# Patient Record
Sex: Male | Born: 1935 | Race: White | Hispanic: No | Smoking: Former smoker
Health system: Southern US, Community
[De-identification: ages and names within clinical notes are randomized; demographics above are authoritative.]

## PROBLEM LIST (undated history)

## (undated) DIAGNOSIS — Z8673 Personal history of transient ischemic attack (TIA), and cerebral infarction without residual deficits: Secondary | ICD-10-CM

## (undated) DIAGNOSIS — I4891 Unspecified atrial fibrillation: Secondary | ICD-10-CM

## (undated) DIAGNOSIS — I10 Essential (primary) hypertension: Secondary | ICD-10-CM

## (undated) DIAGNOSIS — I639 Cerebral infarction, unspecified: Secondary | ICD-10-CM

## (undated) HISTORY — DX: Personal history of transient ischemic attack (TIA), and cerebral infarction without residual deficits: Z86.73

## (undated) HISTORY — DX: Unspecified atrial fibrillation: I48.91

---

## 2006-06-27 ENCOUNTER — Emergency Department (HOSPITAL_COMMUNITY): Admission: EM | Admit: 2006-06-27 | Discharge: 2006-06-27 | Payer: Self-pay | Admitting: Emergency Medicine

## 2006-07-21 ENCOUNTER — Encounter: Payer: Self-pay | Admitting: Internal Medicine

## 2006-07-22 ENCOUNTER — Ambulatory Visit: Payer: Self-pay | Admitting: Thoracic Surgery (Cardiothoracic Vascular Surgery)

## 2006-07-22 ENCOUNTER — Ambulatory Visit: Payer: Self-pay | Admitting: Infectious Diseases

## 2006-07-22 ENCOUNTER — Inpatient Hospital Stay (HOSPITAL_COMMUNITY): Admission: AD | Admit: 2006-07-22 | Discharge: 2006-07-24 | Payer: Self-pay | Admitting: Internal Medicine

## 2006-07-22 ENCOUNTER — Ambulatory Visit: Payer: Self-pay | Admitting: Internal Medicine

## 2006-07-23 ENCOUNTER — Encounter (INDEPENDENT_AMBULATORY_CARE_PROVIDER_SITE_OTHER): Payer: Self-pay | Admitting: Cardiology

## 2006-08-01 ENCOUNTER — Emergency Department (HOSPITAL_COMMUNITY): Admission: EM | Admit: 2006-08-01 | Discharge: 2006-08-01 | Payer: Self-pay | Admitting: Emergency Medicine

## 2006-09-07 ENCOUNTER — Ambulatory Visit: Payer: Self-pay | Admitting: Cardiology

## 2006-09-07 ENCOUNTER — Inpatient Hospital Stay (HOSPITAL_COMMUNITY): Admission: EM | Admit: 2006-09-07 | Discharge: 2006-09-10 | Payer: Self-pay | Admitting: Emergency Medicine

## 2006-09-10 ENCOUNTER — Ambulatory Visit: Payer: Self-pay | Admitting: Physical Medicine & Rehabilitation

## 2006-09-10 ENCOUNTER — Inpatient Hospital Stay (HOSPITAL_COMMUNITY)
Admission: RE | Admit: 2006-09-10 | Discharge: 2006-09-19 | Payer: Self-pay | Admitting: Physical Medicine & Rehabilitation

## 2006-09-19 ENCOUNTER — Inpatient Hospital Stay: Admission: AD | Admit: 2006-09-19 | Discharge: 2006-11-21 | Payer: Self-pay | Admitting: Internal Medicine

## 2006-10-27 ENCOUNTER — Ambulatory Visit (HOSPITAL_COMMUNITY): Admission: RE | Admit: 2006-10-27 | Discharge: 2006-10-27 | Payer: Self-pay | Admitting: Internal Medicine

## 2006-11-10 ENCOUNTER — Ambulatory Visit: Payer: Self-pay | Admitting: Physical Medicine & Rehabilitation

## 2006-11-10 ENCOUNTER — Encounter
Admission: RE | Admit: 2006-11-10 | Discharge: 2006-12-30 | Payer: Self-pay | Admitting: Physical Medicine & Rehabilitation

## 2006-12-18 ENCOUNTER — Ambulatory Visit: Payer: Self-pay | Admitting: Cardiology

## 2006-12-24 ENCOUNTER — Encounter (HOSPITAL_COMMUNITY): Admission: RE | Admit: 2006-12-24 | Discharge: 2006-12-30 | Payer: Self-pay | Admitting: Family Medicine

## 2006-12-31 ENCOUNTER — Encounter (HOSPITAL_COMMUNITY): Admission: RE | Admit: 2006-12-31 | Discharge: 2007-01-30 | Payer: Self-pay | Admitting: Internal Medicine

## 2007-01-09 ENCOUNTER — Ambulatory Visit (HOSPITAL_COMMUNITY): Admission: RE | Admit: 2007-01-09 | Discharge: 2007-01-09 | Payer: Self-pay | Admitting: Internal Medicine

## 2007-02-02 ENCOUNTER — Encounter (HOSPITAL_COMMUNITY): Admission: RE | Admit: 2007-02-02 | Discharge: 2007-03-04 | Payer: Self-pay | Admitting: Internal Medicine

## 2007-02-09 ENCOUNTER — Encounter
Admission: RE | Admit: 2007-02-09 | Discharge: 2007-02-10 | Payer: Self-pay | Admitting: Physical Medicine & Rehabilitation

## 2007-02-09 ENCOUNTER — Ambulatory Visit: Payer: Self-pay | Admitting: Physical Medicine & Rehabilitation

## 2007-03-06 ENCOUNTER — Encounter (HOSPITAL_COMMUNITY): Admission: RE | Admit: 2007-03-06 | Discharge: 2007-04-01 | Payer: Self-pay | Admitting: Internal Medicine

## 2010-08-14 NOTE — Group Therapy Note (Signed)
NAMEDELWYN, SCOGGIN                 ACCOUNT NO.:  [id number]   MEDICAL RECORD NO.:  [id number]          PATIENT TYPE:  INP   LOCATION:  A228                          FACILITY:  [facility]   PHYSICIAN:  Catalina Pizza, M.D.        DATE OF BIRTH:  [birth year]   DATE OF PROCEDURE:  09/08/2006  DATE OF DISCHARGE:                                 PROGRESS NOTE   SUBJECTIVE:  Mr. 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  He also has a  history of atrial fibrillation but has refused anticoagulation before  knowing the risks of having a potential stroke.  He had a recent episode  of some congestive heart failure, found to have an EF of approximately  40%.  Had the slurred speech and right-sided weakness, which started  yesterday morning, and was brought into the emergency department and  apparently the ER physician spoke with a stroke doctor at [facility] and  it was agreed upon to keep him here but acutely heparinize him given his  atrial fibrillation.  The patient did get MRI this morning and results  are outlined below.  The patient at this time denies any specific pain  but continues to have aphasia and a question of some increased weakness  on the right side.   OBJECTIVE:  VITAL SIGNS:  Temperature is 97.0, blood pressure 161/73,  pulse 59, respirations 18, saturating 97% on 1 L of oxygen.  GENERAL:  This is an elderly white male lying in bed in no acute  distress.  HEENT:  Unremarkable.  Oropharynx is clear.  Mucous membranes are moist.  LUNGS:  Clear to auscultation bilaterally.  HEART:  Irregularly irregular rhythm.  A question of faint 1/6 systolic  murmur at the left upper sternal border.  ABDOMEN:  Soft, nontender, nondistended, positive bowel sounds.  EXTREMITIES:  No lower extremity edema.  SKIN:  No rash or petechiae apparent.  NEUROLOGIC:  The  patient is alert and oriented x3, but he does have some  expressive aphasia, unable to get all his words out.  Does take more  time to enunciate things.  He has right facial droop, which is more  apparent than previous at this time.  He also has some right-sided  pronator drift as well as some generalized weakness, approximately 4 to  +/5 on the right arm and right leg as compared to left side.  Follows  commands appropriately.   Lab work obtained:  As previously reviewed yesterday.  No significant  new lab work apparent.   Carotid Dopplers were done, which show minimal plaque in the left ICA  and no evidence of hemodynamically significant stenosis.  MRI/MRA  findings were consistent with acute ischemic infarct in the left  parietal subcortical white matter area, old left cerebellar lacune, and  mild small-vessel disease changes seen throughout.  MRA showed probable  high-grade stenosis in the proximal left middle cerebral artery.   IMPRESSION:  This  is an 77 year old gentleman with new stroke findings  with right hemiparesis and aphasia.   ASSESSMENT AND PLAN:  1. 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  The patient is heparinized at this time      and continues to do so.  Did speak with the stroke doctor nurse      practitioner and I need to better find his wishes as far as whether      to coumadinize him.  Dependent on his wishes, he has refused prior      to now even knowing the risks of this.  Unclear if this is acutely      related to atrial fibrillation since it is only one specific      lesion, no embolic-type noted, it is more ischemic-related.  He has      refused multiple blood pressure medicines as well and was recently      started on Diovan in addition to metoprolol.  Will resume these      medicines without significantly lowering his blood pressure given      his recent stroke.  2. Atrial fibrillation.  He has  slow-rate atrial fibrillation at this      time and has apparently had this for long standing and has refused      coumadinization.  We will continue with the metoprolol for rate      control and we will continue to monitor.  3. Osteomyelitis.  The patient is finishing a 3-week additional      treatment after getting approximately 6 weeks of Ancef at this      time.  Was going to reassess in 3 weeks and see whether to      transition over to oral Keflex and continue this for another month      per infectious disease, Dr. Bonnetta Barry recommendations.   DISPOSITION:  We will continue the patient at his current situation and  repeat 2 D echo at this point and have to better define what his  treatment wishes are for his atrial fibrillation and whether Coumadin is  the best option for him.  We will get Dr. Gerilyn Pilgrim to see the patient.  Likely will need rehab, both speech therapy and significant physical  therapy and may be a good candidate for rehab at [facility].  We will  have to discuss further with the patient and his daughter.      Catalina Pizza, M.D.  Electronically Signed     ZH/MEDQ  D:  09/08/2006  T:  09/08/2006  Job:  [id number]

## 2010-08-14 NOTE — Letter (Signed)
December 18, 2006    Catalina Pizza, M.D.  [address]  [place],  [place] [address]   RE:  [name], [name]  MRN:  [id number]  /  DOB:  February 06, 1916   Dear [name]:   It was my pleasure evaluating [name] in consultation today in the  office at your request.  As you know, this nice gentleman has enjoyed  generally excellent health despite the fact that he is nearly 77 years  of age.  Unfortunately despite having known atrial fibrillation, he had  previously refused to take Warfarin and suffered a left parietal CVA in  June.  His course was complicated by sternal osteomyelitis, but he  eventually went to rehabilitation at [facility] and did well.  His aphasia has improving substantially. He has some right sided  weakness but gets around well with a cane.  He suffered a fall that  occurred when he was first re-learning to walk stairs.  Bradycardia has  intermittently been noted prompting this referral.  There is also a  vague history of congestive heart failure.  His echocardiogram showed  mildly impaired left ventricular systolic function with an ejection  fraction of 45%.  The patient's family, with whom he resides, recently  noticed some pedal edema.  This resolved after a few days of diuretic.   [name] was working 32 hours per week at [employer] before his CVA.  He  has not yet returned to that but hopes to.  INR was 2.5 six days ago.  The patient denies all symptoms.  He has had no dizziness.  He has not  suffered any syncopal spells.  He has no dyspnea nor chest discomfort.   CURRENT MEDICATIONS:  1. Warfarin as directed from your office.  2. Avapro 300 mg daily.  3. Clonidine 0.1 mg b.i.d.   PAST MEDICAL HISTORY:  Notable for:  1. Hypertension.  2. Mild chronic kidney disease.  3. Diverticular disease.   [name] has not previously undergone surgery.  Blood pressure control  has been good.   SOCIAL HISTORY:  As noted above; widowed with 3 children.   FAMILY HISTORY:   Mother and father both died in their 32s.  He also has  8 siblings who are deceased.  Their illnesses included diabetes,  malaria, neoplastic disease, and CVA.   REVIEW OF SYSTEMS:  Notable for the need for corrective lenses, previous  cataract surgery, some hearing impairment, urinary frequency, and  diffuse arthritic discomfort.   PHYSICAL EXAMINATION:  GENERAL:  A trim pleasant gentleman with mild  dysphasia in no acute distress.  VITAL SIGNS:  The weight is 136.  Blood pressure 150/70, heart rate 47  and irregular, respirations 16.  NECK:  No jugular venous distention; normal carotid upstrokes without  bruits.  ENDOCRINE:  No thyromegaly.  HEMATOPOIETIC:  No adenopathy.  SKIN:  No significant lesions.  LUNGS:  Clear.  CARDIAC:  Normal 1st and 2nd heart sounds; modest systolic ejection  murmur.  Normal PMI.  ABDOMEN:  Soft and nontender; normal bowel sounds; no organomegaly.  EXTREMITIES:  One half plus ankle edema; distal pulses intact.  NEUROMUSCULAR:  Four out of 5 strength on the right, more notable in the  lower extremity.   EKG:  Atrial fibrillation with a slow ventricular response; left  anterior fascicular block; nonspecific ST segment abnormalities; delayed  R wave progression - cannot exclude prior septal myocardial infarction;  no old tracing for comparison.   IMPRESSION:  [name]  has sick sinus syndrome.  Despite the presence of  atrial fibrillation and the absence of AV nodal blocking agents, heart  rate is only 47.  Although clonidine may be associated with brady  arrhythmias, I doubt that is playing a role in this case.  In the  absence of symptoms, no intervention is warranted.   I discussed the possible need for pacing with [name] and his  daughter.  The patient says that he would refuse pacemaker if required.  This would be unfortunate, but may not prove to be the  case when symptomatic bradycardia actually occurs.  Please let me know  if [name]  experiences dizziness or syncope.  I will plan to see this  nice gentleman again in 9 months and leave management of all of his  medical problems including anemia and anticoagulation to your  discretion.    Sincerely,      Gerrit Friends. Dietrich Pates, MD, [facility]  Electronically Signed    RMR/MedQ  DD: 12/18/2006  DT: 12/18/2006  Job #: [id number]

## 2010-08-14 NOTE — Discharge Summary (Signed)
[name], [name]                 ACCOUNT NO.:  [id number]   MEDICAL RECORD NO.:  [id number]          PATIENT TYPE:  IPS   LOCATION:  4025                         FACILITY:  [facility]   PHYSICIAN:  Ranelle Oyster, M.D.DATE OF BIRTH:  [birth year]   DATE OF ADMISSION:  09/10/2006  DATE OF DISCHARGE:                               DISCHARGE SUMMARY   DISCHARGE DIAGNOSES:  1. Left parietal subcortical cerebrovascular accident.  2. Aphasia.  3. Dysphagia.  4. Atrial fibrillation.  5. Staphylococcus sternal osteomyelitis.  6. Mild renal insufficiency.  7. Hypertension.  8. Bradycardia.   An 77 year old white male, history of atrial fibrillation but never on  anticoagulation; sternal osteomyelitis with questionable etiology April  2008 with intravenous Ancef per MRI.  Recent echocardiogram with  ejection fraction 40-45%.  The patient had refused TEE.  Admitted September 07, 2006, to [facility] with slurred speech and  decreased gait.  MRI of the head showed patchy acute ischemic  infarction, left parietal subcortical white matter, as well as old  lacunar infarction left cerebellar hemisphere.  Carotid Dopplers were  negative for internal carotid artery stenosis.  MRA showed left MCA  proximal with high-grade hemodynamic-significant stenosis as well as 50%  stenosis in the proximal basilar artery.  Placed on intravenous heparin  and Coumadin for both atrial fibrillation and cerebrovascular accident.  Remained on intravenous Ancef for sternal osteomyelitis with blood  cultures positive for Staphylococcus aureus per Dr. Ninetta Lights of  infectious disease.  Antibiotics were to be completed on September 28, 2006,  and then transitioned to Keflex 500 mg four times a day x1 month.   PAST MEDICAL HISTORY:  1. Hypertension.  2. Staphylococcus osteomyelitis of the sternum April 2008.  3. Atrial fibrillation.  4. Diverticulitis.   No alcohol or tobacco.   ALLERGIES:  Questionable to  ASPIRIN.   SOCIAL HISTORY:  Lives with daughter.  He worked part-time at [employer]  prior to admission.   MEDICATIONS PRIOR TO ADMISSION:  Advil, intravenous Ancef, and  hydrochlorothiazide.  His antibiotics were administered by [facility].   REHABILITATION HOSPITAL COURSE:  The patient was admitted to inpatient  rehab services with therapies initiated on a 3-hour-daily basis  consisting of physical therapy, occupational therapy, speech therapy,  and rehabilitation nursing.  The following issues were addressed during  the patient's rehabilitation stay.  Pertaining to [name] left  parietal subcortical cerebrovascular accident, remained stable.  He was  minimum to moderate assist for transfers, total assist ambulation,  minimum to moderate assist for wheelchair mobility, needing moderate  cues.  Noted to be aphasic.  He was able to communicate basic needs.  He  was followed by speech therapy for swallowing difficulties after stroke.  His diet was a dysphagia one nectar-thick liquids diet.  His thickened  liquids were administered by teaspoon.  He was full supervision.  They  were crushing his medications and giving them in puree.  Due to his  thickened liquids, close monitoring of his renal function.  Latest  creatinine of 1.7, BUN of 45.  His hydrochlorothiazide  had been held on  June 19 with encouragement of fluids with followup chemistries pending.  He had been placed on intravenous heparin and Coumadin for his atrial  fibrillation, latest INR of 1.8.  He was on subcutaneous Lovenox once on  the rehab unit until INR greater than 2.  He was completing a course of  intravenous Ancef sternal osteomyelitis to be completed June 29.  After  completion of intravenous Ancef he would transition to 1 month of Keflex  p.o. 500 mg four times daily.  Monitoring of blood pressure.  He did  have some bradycardia, pulse rates of 45.  He was asymptomatic.  His  metoprolol was held June  19 at 25 mg twice daily.  It would be resumed  on June 20 at 12.5 mg twice daily and monitored.   Latest labs showed an INR of 1.9 on June 19, hemoglobin 12.1; hematocrit  36.1; platelets 315,000; WBC of 8.8.  Sodium 138, potassium 4.2, BUN of  45, creatinine 1.7.   DISCHARGE MEDICATIONS AT TIME OF DICTATION:  1. Coumadin 2 mg daily except 4 mg every Tuesday.  2. Avapro 300 mg p.o. daily.  3. Lopressor 12.5 mg twice daily.  4. Desyrel 50 mg p.o. q.h.s. as needed.  5. The patient's hydrochlorothiazide of 25 mg daily was on hold.  6. Potassium chloride 20 mEq daily on hold.  7. Clonidine 0.1 mg p.o. b.i.d.  8. Intravenous Ancef 2 g every 8 hours until September 28, 2006.  Begin      Keflex 500 mg p.o. q.i.d. after Ancef completed for a total of one      more month and stop.   DIET:  Dysphagia one nectar-thick liquids by teaspoon, small bites,  swallow x2 with each bite, full supervision, medications to be crushed  in puree per speech therapy.  Monitoring of renal function while on  thickened liquids.   PICC line in place for administration of intravenous antibiotics.  This  can be discontinued once Ancef completed and changed to p.o. Keflex.   He should follow up Dr. Catalina Pizza, [facility] Ace, medical management.  He  could be covered by attending physician while at skilled nursing  facility at this time.  Dr. Ninetta Lights, infectious disease; Dr. Faith Rogue at the outpatient rehab service office, call for appointment.      Mariam Dollar, P.A.      Ranelle Oyster, M.D.  Electronically Signed    DA/MEDQ  D:  09/18/2006  T:  09/18/2006  Job:  [id number]   cc:   Catalina Pizza, M.D.  Lacretia Leigh. Ninetta Lights, M.D.

## 2010-08-14 NOTE — H&P (Signed)
NAMEDAMOND, BORCHERS                 ACCOUNT NO.:  [id number]   MEDICAL RECORD NO.:  [id number]          PATIENT TYPE:  INP   LOCATION:  A228                          FACILITY:  [facility]   PHYSICIAN:  Edward L. Juanetta Gosling, M.D.DATE OF BIRTH:  June 27, 1915   DATE OF ADMISSION:  09/07/2006  DATE OF DISCHARGE:  LH                              HISTORY & PHYSICAL   PRIMARY CARE PHYSICIAN:  Catalina Pizza, M.D.   REASON FOR ADMISSION:  Stroke.   HISTORY OF PRESENT ILLNESS:  Mr. Robicheaux is an 77 year old who was seen by  his family around 10:30 a.m. this morning.  He had driven himself to the  mall and walked in the mall and then after they came back from church,  he had slurred speech and difficulty with ambulation.  He has a long  known history of atrial fibrillation, he says since WWII, and has never  been on any anticoagulation.  He has a history of a chest wall pain with  sternal osteomyelitis that started in April of this year.  He has been  on Ancef and seems to have gotten better, but he says that he needs  another 3 weeks of the antibiotic.  He also has a history of  hypertension and is not totally clear what all he is taking.  Apparently, he is still taking Ancef, hydrochlorothiazide for his blood  pressure and was recently started on Diovan.  He has had a waxing and  waning course of slurred speech and difficulty with expressive aphasia  in the emergency room.  Dr. Lynelle Doctor, who is the emergency room physician,  has had multiple conversations with the stroke team at [facility] and  after reviewing his data, reviewing his x-rays and CT, etc., we feel  that there is little to do except to go ahead and anticoagulate him now  because of the atrial fibrillation.   PAST MEDICAL HISTORY:  1. Hypertension.  2. Atrial fibrillation, chronic.  3. Diverticulitis and diverticulosis and he apparently had some sort      of a ruptured abdominal diverticulum many years ago and was told      that he should not  take aspirin.  4. Sternal osteomyelitis.  He has been taking Advil as needed, Ancef      now for a period of a almost 2 months and hydrochlorothiazide, I do      not know the dosing, and recently had Diovan, I do not know the      dose, added from Dr. Scharlene Gloss office.  This was a sample.   ALLERGIES:  He is said to be allergic to ASPIRIN, but that is because  they said that he should stop taking it.   SOCIAL HISTORY:  He has worked as a Airline pilot.  He is currently working  want to take greeter or did up until 2 months ago.  He had about a 20-  pack-year smoking history, none in the last 20 years or more.  He does  not drink any alcohol.  Does not use any illicit drugs.   FAMILY HISTORY:  Positive apparently for stroke, but it is difficult to  be sure from him because of his aphasia.   PHYSICAL EXAMINATION:  GENERAL:  He is moving all four extremities.  VITAL SIGNS:  Temperature 98, pulse 59 and irregular, respirations 16,  O2 saturations 95% on room air, blood pressures ranged between 157/73 to  192/74.  HEENT:  His pupils are reactive.  Nose and throat are clear.  His mucous  membranes are moist.  NECK:  Supple.  I do not hear a bruit.  He does not have any jugular  venous distension.  CHEST:  Fairly clear without wheezes, rales or rhonchi.  HEART:  Irregular.  I do not hear a murmur, but he said to have some  mitral valve incompetence.  I do not actually hear the murmur now.  He  is talking during the exam, so it is a little difficult to be sure.  ABDOMEN:  Soft.  EXTREMITIES:  No clubbing, cyanosis or edema.  NEUROLOGIC:  He previously did have a facial droop which has resolved.  He still is somewhat aphasic.  He had some weakness of his right hand  and some numbness of his right hand and that has resolved.  He has  normal, equal, bilateral strength in both his arms and his legs.   LABORATORY DATA AND X-RAY FINDINGS:  White blood count 7900, hemoglobin  11.1, platelets 270.  PT  14.9 with an INR of 1.1.  Cardiac markers  normal.  Comprehensive metabolic profile with glucose 045, BUN 29,  creatinine 1.3, albumin 3.3.  PTT greater than 200.  He has had heparin.   CT of head with hyperdense, left middle cerebral artery branch in the  left sylvian fissure, acute thrombus not excluded, otherwise, no signs  of acute ischemia.  As mentioned, this has been reviewed by the  neurologist and the invasive radiologist.  He does not have any other  findings on CT.   ASSESSMENT/PLAN:  Acute neurological change which has been waxing and  waning to some extent.  It is recommended by the neurologist that he be  placed on heparin because of his atrial fibrillation.  He is going to  have an MRI/MRA of the brain, echocardiogram, carotid Doppler study,  speech evaluation.  He is going to continue on his Ancef.  He is going  to continue on his Diovan.  Continue on his hydrochlorothiazide, but at  this point, there is little else to do.  He does have, according to  consultation from April of this year, mitral regurgitation, tricuspid  regurgitation and left ventricular ejection fraction of about 40%.      Edward L. Juanetta Gosling, M.D.  Electronically Signed     ELH/MEDQ  D:  09/07/2006  T:  09/08/2006  Job:  [id number]

## 2010-08-14 NOTE — Group Therapy Note (Signed)
NAMEJAAMAL, [name]                 ACCOUNT NO.:  [id number]   MEDICAL RECORD NO.:  [id number]          PATIENT TYPE:  INP   LOCATION:  A228                          FACILITY:  [facility]   PHYSICIAN:  Catalina Pizza, M.D.        DATE OF BIRTH:  [birth year]   DATE OF PROCEDURE:  09/09/2006  DATE OF DISCHARGE:                                 PROGRESS NOTE   SUBJECTIVE:  [name] is an unfortunate 77 year old gentleman who  presented with acute stroke and found to have left acute ischemic  infarct with right-side hemiparesis and aphasia.  His functionality and  his aphasia is waxing and waning somewhat, but continues to have  difficulty with slurred speech and getting the words out, as well as  some right-sided weakness.   OBJECTIVE:  VITAL SIGNS:  Temperature is 98.1, blood pressure is ranging  between 154/77 up to 187/83.  Pulse is 47 to 61, respirations are 20,  saturating 99% on room air.  GENERAL:  This is an elderly gentleman lying in bed in no acute  distress, alert and oriented to person and place and time.  HEENT:  Unremarkable.  Does have significant right-sided facial droop.  LUNGS:  Clear to auscultation bilaterally.  HEART:  Irregularly irregular, faint 1/6 systolic murmur left upper  sternal border.  ABDOMEN:  Soft, nontender, nondistended, positive bowel sounds.  EXTREMITIES:  No lower extremity edema.  NEUROLOGIC:  The patient may have a question of mild improvement in  strength in his right arm and definite improvement in his right leg.  Still has significant aphasia and facial droop.   Lab work obtained today shows white count of 8.8, hemoglobin of 12.2.  Heparin level within range.  PT starting at 15.6, INR of 1.2.   IMPRESSION:  This is an 77 year old gentleman with new stroke, findings  of right hemiparesis and aphasia.   ASSESSMENT AND PLAN:  1. Left acute ischemic infarct with right-sided hemiparesis and      aphasia.  Awaiting further input from Dr. Gerilyn Pilgrim, but in  speaking      with Dr. Pearlean Brownie with the stroke team and discussing with his      daughter, the patient has agreed to start on Coumadin to prevent      any further signs of this.  He does have significant narrowing      noted on MRA and this may prevent any further new reoccurrence.  2. Atrial fibrillation.  His heart rate is slow at this point and will      continue with the metoprolol as well as the Diovan.  3. Hypertension.  He has significant elevation of blood pressure.  I      do not want to bottom it out, given his perfusion of his brain,      with a goal possibly of systolic blood pressure 150.  He has      refused all blood pressure medicines up until recently and started      back on medicines, probably not reaching maximal effect at this  time.  4. Osteomyelitis.  Continue with the current treatment with Ancef.   DISPOSITION:  In discussion with the patient and his daughter, [name],  agree that he will need significant rehab.  Following Dr. Gerilyn Pilgrim, we  will get social work and care management to work on getting physical  therapy and speech therapy and occupational therapy to assess the  patient, and I believe he would be a good candidate for rehab at [facility].      Catalina Pizza, M.D.  Electronically Signed     ZH/MEDQ  D:  09/09/2006  T:  09/09/2006  Job:  [id number]

## 2010-08-14 NOTE — Procedures (Signed)
NAMETELFORD, ARCHAMBEAU                 ACCOUNT NO.:  [id number]   MEDICAL RECORD NO.:  [id number]          PATIENT TYPE:  INP   LOCATION:  A228                          FACILITY:  [facility]   PHYSICIAN:  Gerrit Friends. Dietrich Pates, MD, FACCDATE OF BIRTH:  25-Nov-1915   DATE OF PROCEDURE:  09/09/2006  DATE OF DISCHARGE:  09/10/2006                                ECHOCARDIOGRAM   REFERRING:  Catalina Pizza, M.D.   CLINICAL DATA:  An 77 year old gentleman with CVA; history of  hypertension.  M-mode:  Aorta 3.5, left atrium 4.5, septum 1.2,  posterior wall 1.2, LV diastole 4.8, LV systole 3.6.   1. Technically adequate echocardiographic study.  2. Mild left and right atrial enlargement.  3. Right ventricular size at the upper limit of normal; normal wall      thickness; normal RV systolic function.  4. Trileaflet aortic valve with minimal sclerosis.  5. Mildly thickened mitral valve with minimal calcification      ossification and mild regurgitation.  6. Normal tricuspid valve; mild regurgitation; estimated RV systolic      pressure at the upper limit of normal.  7. Normal pulmonic valve and proximal pulmonary artery.  8. Normal left ventricular size; borderline LVH; low normal LV      systolic function.  9. Normal IVC.  10.Normal thoracic aorta.      Gerrit Friends. Dietrich Pates, MD, [facility]  Electronically Signed     RMR/MEDQ  D:  09/10/2006  T:  09/10/2006  Job:  [id number]

## 2010-08-14 NOTE — Group Therapy Note (Signed)
NAMETEDFORD, BERG                 ACCOUNT NO.:  [id number]   MEDICAL RECORD NO.:  [id number]          PATIENT TYPE:  ORB   LOCATION:  S159                          FACILITY:  [facility]   PHYSICIAN:  Angus G. 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      Angus G. Renard Matter, MD  Electronically Signed     AGM/MEDQ  D:  09/25/2006  T:  09/25/2006  Job:  [id number]

## 2010-08-14 NOTE — Assessment & Plan Note (Signed)
[name] is back regarding his left parietal stroke.  He has been at  home and generally he had been doing well.  He apparently had one fall  where he slipped at home but otherwise has been doing okay since then.  He denies pain today except for a little discomfort at the right hip.  He is sleeping well.  He has a little bit of anxiety with falling but  generally has no other issues.   REVIEW OF SYSTEMS:  Is notable for the above.  He does report some poor  appetite.  A full review is in the written Health & History Section on  the chart.   SOCIAL HISTORY:  Patient is divorced and living alone with family  checking on him.   PHYSICAL EXAM:  Blood pressure is 160/80, pulse is 50, respiratory rate  16, sating 100% in room air.  Patient is pleasant, alert and oriented  x3.  The patient does really well with conversational speech but has  some difficulty naming objects.  He has some dysarthria and hypophonia  but this improved.  Strength is improved to near 5/5 on all 4  extremities.  He still has some apraxia with the right hand and foot but  seems fairly stable on his feet.  He walks with a rolling walker without  any other support.  No hypertonicity is seen.  Sensation is grossly  intact.  Cognition is appropriate.  HEART:  Regular rhythm, slightly bradycardic.  CHEST:  Clear.  ABDOMEN:  Soft, nontender.   ASSESSMENT:  1. Left parietal/subcortical stroke.  2. Aphasia.  3. Dysphagia.  4. Atrial fibrillation.  5. Hypertension.   PLAN:  1. Continue therapies per recommendations of outpatient center.  I      will sign off on whatever we need to going forward here.  He should      transition to a home exercise program.  2. I have nothing further to offer [name] today.  He is generally      doing quite well.  I will follow up with him on a p.r.n. basis in      the future.      Ranelle Oyster, M.D.  Electronically Signed     ZTS/MedQ  D:  02/10/2007 11:11:34  T:   02/10/2007 16:49:19  Job #:  [id number]   cc:   Catalina Pizza, M.D.  Fax: [phone]

## 2010-08-14 NOTE — Consult Note (Signed)
[name], BLOXHAM                 ACCOUNT NO.:  [id number]   MEDICAL RECORD NO.:  [id number]          PATIENT TYPE:  INP   LOCATION:  A228                          FACILITY:  [facility]   PHYSICIAN:  Kofi A. Gerilyn Pilgrim, M.D. DATE OF BIRTH:  [birth year]   DATE OF CONSULTATION:  09/09/2006  DATE OF DISCHARGE:                                 CONSULTATION   This is an 77 year old white male who presented to the hospital with  slurred speech and difficulty talking along with difficulty ambulating.  The patient has a long history of atrial fibrillation but was never on  anticoagulation.  The patient's workup includes a head CT scan and MRI,  both with evidence of acute left MCA infarct, particularly involving the  parietal area.   PAST MEDICAL HISTORY:  1. Chronic atrial fibrillation.  2. Hypertension.  3. Diverticular disease/diverticulitis.  4. Sternal osteomyelitis.   ADMISSION MEDICATIONS:  1. Ancef.  2. Hydrochlorothiazide.   ALLERGIES:  ASPIRIN.   CURRENT MEDICATIONS:  1. Hydrochlorothiazide.  2. Ancef.  3. Metoprolol.  4. Warfarin.   SOCIAL HISTORY:  The patient previously worked as an Airline pilot. He has  a 20-pack-year smoking history, though he has not smoked in over 20  years.  No alcohol use.  No illicit drug use.   PHYSICAL EXAMINATION:  GENERAL:  This is a pleasant gentleman in no  acute distress  VITAL SIGNS:  Temperature 98.3, pulse 56, respirations 16, blood  pressure 167/81.  HEENT:  Neck is supple.  Head is normocephalic, atraumatic.  ABDOMEN:  Soft.  EXTREMITIES:  No significant edema or varicosities.  MENTATION:  His carotid Doppler was negative.  The patient is awake and  alert.  He cooperates well with the physical examination.  He has an  obvious expressive aphasia with significantly impaired fluency.  He does  well on commands.  Repetition is significantly impaired.  Naming is also  severely impaired, but comprehension is preserved.  Cranial nerve  evaluation shows a right hemianopsia anopia.  Pupils are equal, round,  and reactive to light and accommodation.  Extraocular movements are  intact.  There is flattening of the nasal labial fold on the right.  Tongue is midline.  Uvula is midline.  Right shoulder shrug and motor  examination shows a dense right hemiparesis, right upper extremity 2/5  and right lower extremity 3/5.  The left side shows normal tone, bulk  and strength.  Coordination shows no tremors, rigidity or bradykinesia.  Reflexes are symmetric.  Sensation is somewhat unreliable but seems  unremarkable to light touch bilaterally.    MRI of the brain shows acute infarct involving the left parietal area.  There is an old left cerebellar infarct.  MRA of the brain shows drop-  off of the left MCA which is expected given his infarct.  There is  approximately 50% stenosis over the proximal basilar   ASSESSMENT:  1. Acute left parietal infarct in the setting of atrial fibrillation.  2. Proximal basilar artery stenosis, MRA estimation with 50%, though      MRA tends to over-estimate; clinical significance  doubtful at this      time.  3. Hypertension.   RECOMMENDATIONS:  Agree with chronic Warfarin therapy and hypotensive  control.      Kofi A. Gerilyn Pilgrim, M.D.  Electronically Signed     KAD/MEDQ  D:  09/10/2006  T:  09/10/2006  Job:  [id number]

## 2010-08-14 NOTE — Group Therapy Note (Signed)
[name], [name]                 ACCOUNT NO.:  [id number]   MEDICAL RECORD NO.:  [id number]          PATIENT TYPE:  ORB   LOCATION:  S159                          FACILITY:  [facility]   PHYSICIAN:  Catalina Pizza, M.D.        DATE OF BIRTH:  April 24, 1915   DATE OF PROCEDURE:  09/29/2006  DATE OF DISCHARGE:                                 PROGRESS NOTE   This is a history and physical for the nursing home. The patient is a  resident at [facility]. Was discharged from rehab by Dr. Riley Kill  on September 18, 2006.   RECENT HISTORY IN SUMMATION:  [name] is a pleasant 77 year old  gentleman who up until April of this year had minimal medical problems  and  did not see a doctor routinely. At that time,  he developed a staph  osteomyelitis in his chest. Had a short hospitalization at [facility].  Noted to have some congestive heart failure as well as atrial  fibrillation. Refused anticoagulation. At that time, he did have  significant hypertension as well, which was refusing most all of  therapy. He was started on IV antibiotics and this was continued for 6  weeks routinely.  He then acutely developed slurred speech and  difficulty with gait and aphasia  and found to have significant left  parietal subcortical CVA. At that time he was seen by Neurology that  started him on Coumadin, would be the best option.  He was sent down to  [facility] for approximately 9 days worth of rehab and at that  time was sent back up to [facility] for further rehabilitation.   PAST MEDICAL HISTORY:  1. Hypertension.  2. 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  3. Recent diagnosis atrial fibrillation.  4. History of diverticulosis.  5. Cerebral vascular accident.   FAMILY HISTORY:  Positive for coronary artery disease.   REVIEW OF SYSTEMS:  Patient at this point denies any chest pain.  No  problems with breathing. States he is still having some difficulty with  getting the words  out appropriately.  Obvious neurologic deficits as far  as his full strength and mobility as outlined previously by Dr. Riley Kill.   SOCIAL HISTORY:  Has a daughter and son-in-law whom the patient was  previously living with in the short term after living by himself for a  long period time. He does not have any alcohol or tobacco use.  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   ALLERGIES:  QUESTIONABLE INTOLERANCE TO ASPIRIN.   MEDICATIONS:  1. Coumadin. At this time his Coumadin is being adjusted secondary to      subtherapeutic.  2. Avapro 300 mg p.o. daily.  3. Lopressor 12.5 mg b.i.d. is on hold secondary to bradycardia.  4. Desyrel 50 mg q.h.s. p.r.n. for sleep.  5. Previous on hydrochlorothiazide, but believes is on hold at this      time.  6. Clonidine 0.1 mg p.o. b.i.d.  7. He  has just finished his Ancef IV and started on Keflex 500 mg      q.i.d. for another month course.   DIET:  He is currently on dysphagia one nectar-thick liquids diet.  Believe they are trying to advance this further dependent on  speech  therapy recommendations.   PHYSICAL EXAMINATION:  VITAL SIGNS:  Temperature is 97.8, blood pressure  is 110/54 up to 157/78, respirations are 20, heart rate is ranging  between 38 and 68, satting 100% on room air.  GENERAL:  This is a well-appearing, elderly, white male sitting in  wheelchair in no acute distress.  HEENT is normocephalic, atraumatic. Pupils equal and react to light and  accommodation.  Does have some right facial droop.  CARDIOVASCULAR:  Regular rate and rhythm but very slow; did not  appreciate any murmurs.  ABDOMEN: Is soft, nontender, positive bowel sounds.  LUNGS:  Clear to auscultation bilaterally.  NEUROLOGIC:  The patient is alert and oriented x3; some difficulty with  getting out some of his words. Sensation is intact.  Does have normal  reflexes in all extremities. Has approximately 5/5 strength  in the left  upper and lower extremities,  but has approximately 4/5 strength and  poor coordination of the right upper extremity and has approximately 4/5  strength in the right lower extremity. No specific cerebellar  dysfunction at this time.  As mentioned above,  does continue to have  some expressive aphasia.   IMPRESSION:  This is an elderly  77 year old gentleman with recent  stroke,  current resident at [facility], getting routine  rehabilitation.   ASSESSMENT/PLAN:  1. Atrial fibrillation. The patient does have slow heart rate at this      time but given his recent stroke then will keep the patient on      Coumadin. No signs of bleeding at this time. Still subtherapeutic      on his Coumadin and continue to monitor and increased      appropriately.  2. Left parietal subcortical cerebral vascular accident with aphasia      and some right-sided weakness. He is continuing to get rehab on      this. It does show slow improvement from previous exam after      intensive rehab at [facility] on the care of Dr. Riley Kill. He      is scheduled to have a follow-up with him over the next several      weeks for reevaluation to see if there is anything else from a      rehab standpoint that would benefit [name].  3. History of congestive heart failure.  This has been a relatively      new diagnosis. Ejection fraction estimated to be 40-45%. Does not      have any fluid overload at this time and only had one episode      secondary to IV fluids in large amounts, which caused significant      shortness of breath. He does not like to be on any particular      medicines for this, but patient is on Avapro, which as an ARB may      have some benefit.  4. Hypertension. Had significant elevations in blood pressure prior to      all of this and is on clonidine and Avapro and appears to be doing      well.  5. Bradycardia. He has significant bradycardia dropping down into the  upper 30s-40s, but no complaints when this occurs, but will hold      his beta-blocker at this time.  He does appear to be rate      controlled, although continues to be in atrial fibrillation.  6. 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  He did receive a full course of      two months' worth of Ancef and is to be started on Keflex 500 mg      q.i.d. for another month. He is not having any recurrence and no      other signs of problems.  7. Mild renal insufficiency. Did have this at last hospitalization      felt secondary to decreased p.o. intake. Did not totally return      back to normal, but still in the very mild sense. Will continue to      monitor for this.   DISPOSITION:  The patient a resident at [facility]. Need to  discuss with daughter what the plans are for him. If this was just for  rehab consideration and see if the goal would be to get him back home.  Overall, the patient has made slow strides with improvement and this is  very frustrating to him and may definitely have some anxiety, depression  related to his inability to return back to normal as much as he would  like. Will continue to monitor.      Catalina Pizza, M.D.  Electronically Signed     ZH/MEDQ  D:  10/01/2006  T:  10/01/2006  Job:  [id number]   cc:   Ranelle Oyster, M.D.  Fax: [phone]

## 2010-08-14 NOTE — H&P (Signed)
NAMEAC, COLAN                 ACCOUNT NO.:  [id number]   MEDICAL RECORD NO.:  [id number]          PATIENT TYPE:  IPS   LOCATION:  4025                         FACILITY:  [facility]   PHYSICIAN:  Ellwood Dense, M.D.   DATE OF BIRTH:  08/31/1915   DATE OF ADMISSION:  09/10/2006  DATE OF DISCHARGE:                              HISTORY & PHYSICAL   PRIMARY CARE PHYSICIAN:  Catalina Pizza, M.D.   INFECTIOUS DISEASE:  Lacretia Leigh. Ninetta Lights, M.D.   HISTORY OF PRESENT ILLNESS:  Mr. Koeller is an 77 year old elderly  Caucasian male with a history of atrial fibrillation but never treated  with anticoagulation.  He developed sternal osteomyelitis of unknown  etiology approximately April 2008 and was started on IV Ancef by his  primary care physician.  His daughter, whom he lives with, had been  administering the Ancef at home prior to this admission.   The patient underwent a recent echocardiogram, which showed an ejection  fraction of 40-45%.  He had refused a transesophageal echocardiogram  previously.   The patient was admitted September 07, 2006, to [facility] with  slurred speech and poor gait.  MRI study of the brain showed patchy  acute ischemic areas of the left parietal subcortical white matter as  well as an old lacunar infarct of the left cerebellar hemisphere.  Carotid Dopplers were negative.  MRA study showed left middle cerebral  artery proximal lesion with high-grade hemodynamically significant  stenosis as well as 50% stenosis in the proximal basilar artery.  The  patient was placed on IV heparin and Coumadin for treatment of the  atrial fibrillation with new stroke.  He remains on IV Ancef for sternal  osteomyelitis with positive blood culture showing Staph aureus.  He has  been followed by Dr. Ninetta Lights from infectious disease.  They have advised  IV Ancef for 2-1/2 more weeks as it was initially started April 2008.  The plan is then to transition him to p.o. Keflex 500 mg p.o.  q.i.d. for  1 month.   The patient was evaluated by the rehabilitation physicians and felt to  be an appropriate candidate for inpatient rehabilitation.   REVIEW OF SYSTEMS:  Positive for palpitations.   PAST MEDICAL HISTORY:  1. Hypertension.  2. Staphylococcus osteomyelitis of the sternum diagnosed in April 2008      and treated with IV Ancef by [facility].  3. Atrial fibrillation.  4. Diverticulitis.   FAMILY HISTORY:  Positive for coronary artery disease.   SOCIAL HISTORY:  The patient lives with his daughter and son-in-law.  The daughter and son-in-law both work days but they plan to hire  assistants as needed.  The patient does not use alcohol or tobacco.  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   FUNCTIONAL HISTORY PRIOR TO ADMISSION:  Independent and working.   ALLERGIES:  Questionable intolerance of ASPIRIN.   MEDICATIONS PRIOR TO ADMISSION:  1. Advil p.r.n.  2. Ancef IV t.i.d.  3. Hydrochlorothiazide daily.   LABORATORY DATA:  Recent hemoglobin was 11.8  with hematocrit of 34.8,  platelet count of 270,000, and white count of 8.8.  Recent sodium was  145, potassium 3.6, chloride 109, CO2 29, BUN 16, and creatinine 1.18.  Recent INR was 1.4 as of today, September 10, 2006.   PHYSICAL EXAMINATION:  GENERAL:  A well-appearing, elderly adult male  lying in bed with O2 by nasal cannula, in no acute distress.  VITAL SIGNS:  Blood pressure is 101/62 with a pulse of 64, respiratory  rate 20, and temperature 97.7.  HEENT:  Normocephalic, atraumatic.  CARDIOVASCULAR:  Irregular rate and rhythm, S1, S2 without murmurs.  ABDOMEN:  Soft, nontender, with positive bowel sounds.  LUNGS:  Clear to auscultation bilaterally.  NEUROLOGIC:  Alert and oriented x2-3 with choices provided and  occasional cues.  Cranial nerve exam showed fairly intact receptive  speech with ability to follow one and even two-step commands.  He was  able to do  addition but was unable to produce names in terms of his  children who are in the room nor his first name.  He was able to pick  out items when given choices in terms of their names.  Left upper and  left lower extremity strength were 5/5.  Bulk and tone were normal and  reflexes were 2+ and symmetric.  Right upper extremity strength was  difficult to assess but he appears to have approximately 3-/5 strength  with poor coordination.  Sensation appeared to be intact to light touch  throughout the right upper extremity.  Right lower extremity exam showed  at least 3+ to 4-/5 strength.  Bulk and tone were normal in the right  lower extremity.   IMPRESSION:  1. Status post left parietal/subcortical stroke with history of atrial      fibrillation, on no anticoagulation.  2. Aphasia, more expressive than receptive, related to #1.  3. Dysphagia, on pureed diet with thin liquids.  4. Recent diagnosis of staphylococcal sternal osteomyelitis, treated      with IV Ancef with eventual conversion to p.o. Keflex.   Presently the patient has deficits in activities of daily living,  transfers, ambulation, cognition, and speech, especially expressive,  along with swallowing related to his above-noted left  parietal/subcortical stroke.   PLAN:  1. Admit to the rehabilitation unit for daily therapies to include      physical therapy for range of motion, strengthening, bed mobility,      transfers, pre-gait training, gait training, and equipment      evaluation.  2. Occupational therapy for range of motion, strengthening, ADLs,      cognitive/perceptual training, splinting, and equipment evaluation.  3. Rehab nursing for skin care, wound care, bowel and bladder training      as necessary.  4. Speech therapy for oral motor exercises, communication aids, and      evaluation of aphasia and dysphagia with Vital Stim as necessary. 5. Case management to assess home environment, assist with discharge       planning, and arrange for appropriate follow-up care.  6. Social worker to assess family and social support and assist in      discharge planning.  7. Continue pureed diet with thin liquids.  8. Check admission labs including CBC and CMET in a.m. September 11, 2006.  9. IV heparin and Coumadin per pharmacy.  10.IV Ancef 2 g q.8h. x18 days.  11.Hydrochlorothiazide 12.5 mg p.o. daily along with Avapro 300 mg      daily and Lopressor 25 mg p.o. b.i.d.,  holding Lopressor for heart      rate less than 60.  12.Protonix 40 mg p.o. daily.  13.Draw blood from IV.  14.IV team for routine care of PICC line.  15.O2 saturations each shift and apply 1-2 L of O2 if saturations less      than or equal to 90%.  16.Dulcolax suppository one per rectum daily p.r.n.  17.Routine turning to prevent skin breakdown.   PROGNOSIS:  Fair/good.   ESTIMATED LENGTH OF STAY:  15-20 days.   GOALS:  Modified independent, basic yes and no expression along with  modified independent transfers and minimum assist ambulation and  modified independent wheelchair mobility and minimum assist to standby  assist for ADLs.           ______________________________  Ellwood Dense, M.D.     DC/MEDQ  D:  09/10/2006  T:  09/10/2006  Job:  [id number]

## 2010-08-14 NOTE — Discharge Summary (Signed)
[name], [name]                 ACCOUNT NO.:  [id number]   MEDICAL RECORD NO.:  [id number]          PATIENT TYPE:  INP   LOCATION:  A228                          FACILITY:  [facility]   PHYSICIAN:  Catalina Pizza, M.D.        DATE OF BIRTH:  July 17, 1915   DATE OF ADMISSION:  09/07/2006  DATE OF DISCHARGE:  06/11/2008LH                               DISCHARGE SUMMARY   DISCHARGE DIAGNOSES:  1. 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  2. Atrial fibrillation.  3. Just started on Coumadin anticoagulation.  4. Staphylococcus osteomyelitis.  5. Hypertension.   DISCHARGE MEDICATIONS:  1. The patient is to continue on Ancef 2 grams IV q.8h. for the next 2-      1/2 weeks.  2. Then be transitioned over to Keflex 500 mg q.i.d. x1 month, due to      his osteomyelitis.  3. Hydrochlorothiazide 12.5 mg p.o. q.d.  4. Diovan 160 mg p.o. q.d.  5. Lopressor 25 mg p.o. b.i.d.  Need to reassess to make sure he is      not having too much bradycardia.  6. Warfarin, start on 7.5 mg p.o., the first dose today to get      anticoagulated.  7. He is currently on a heparin drip but may be able to transition      over to Lovenox for full anticoagulation until Coumadin is      therapeutic.   HISTORY OF PRESENT ILLNESS:  [name] is an unfortunate 77 year old  gentleman who had minimal interaction with a doctor up until about three  months ago, at which time he had substernal chest pain and was found to  have Staphylococcus osteomyelitis, requiring initial hospitalization at  [facility], and started on IV antibiotics for a  prolonged period of time.  He has had a full six-week course of that and  is to have another three weeks, in discussion with Dr. Fransisco Hertz.  He is then to follow up with Keflex.  He stated he has already had an  irregular heart rate and has refused multiple times getting  anticoagulation, due to a history of  GI bleed many, many years ago.  He  presented to the emergency room on Saturday, with slurred speech and  right-sided weakness and was found to have a significant stroke on  MRI/MRA.  He was admitted for further evaluation, and to get on the  proper medicines.   LABORATORY DATA:  Obtained during the hospitalization revealed initial  CBC with a white count of 7.9, hemoglobin 11.1, platelet count 270.  CMET showed a sodium of 142, potassium 3.7, chloride 110, CO2 of 27,  glucose 122, BUN 29, creatinine 1.31.  Total bilirubin 0.5, alkaline  phosphatase 69, SGOT 18, SGPT less than 8, total protein 6.7, albumin  3.3, calcium 8.7.  PT on September 09, 2006, showed a pro-time of 15.6, INR  1.2.   A CT of the head revealed left MCA branch in the sylvian fissure  somewhat hyper-dense, acute thrombus not excluded, otherwise no signs of  acute ischemia.   Carotid Dopplers did not show any significant middle or left ICA, no  hemodynamically significant stenosis.   MRI/MRA revealed patchy acute ischemic infarction in the left parietal  and subcortical white matter.  Showed an old lacune in the left  cerebellar hemisphere.  Small vessel changes throughout.  MRA showed a  significant likely left MCA proximal with high-grade hemodynamic-  significant stenosis. 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   PHYSICAL EXAMINATION:  Please refer to previous dictation prior to  discharge.   HOSPITAL COURSE:  #1 - CEREBROVASCULAR ACCIDENT WITH RIGHT-SIDED  HEMIPARESIS AND APHASIA:  The patient was initially started on heparin,  and in discussion with his daughter, [name], and the patient, as  well as with Dr. Delia Heady with the stroke team at [facility], we agreed that starting him on Coumadin would be the  best option for him, given his atrial fibrillation.  He was started on  Coumadin today and will likely need to  continue to monitor this until  therapeutic.  May transition from the heparin to Lovenox injections at  the time of discharge.  He will need significant speech therapy and  physical therapy and occupational therapy and suggested the best option  for him would be [facility] for intensive rehab. Prior to  all of this, the patient was a Holiday representative at [employer] and was very  functional for an 78 year old, very independent as well, and hopes to  get him back to this functional status.   #2 - ATRIAL FIBRILLATION:  The patient has always had an irregular heart  beat per his report, and has refused Coumadin, but now will be started  on this, given the stroke finding.  Rate control is of utmost  importance.  Started on metoprolol in the office.  Will continue to need  to monitor and make sure he is not having significant bradycardia  related to this.  He did have several slow heart rates in the 50's.   #3 - HYPERTENSION:  His blood pressures have consistently run in the  elevated range in the 170 to 190 range.  The patient refused medications  multiple times, stating that it was much lower at home.  He was recently  started on Diovan.  Unclear if the patient was taking it, but will  continue with the Diovan 160 mg q.d.  He needs to have follow-up BMET,  to assure that his creatinine is remaining stable.  He has only been on  this for approximately one week.   #4 - STAPHYLOCOCCUS OSTEOMYELITIS:  As mentioned above, the patient had  a staph infection in his sternum and was admitted to [facility] and seen by infectious disease, and is to continue on Ancef for  at least another three weeks, and then be transitioned to oral Keflex  for another month.  This is all per Dr. Bonnetta Barry discussion, when he was  seen in the office approximately one week ago.  #5 - QUESTION OF SOME MILD DEPRESSION:  Definitely with this new event  of stroke, need to monitor closely.  The patient  seems to be more blue  and down about his current situation, even prior to this stroke, and  recommend close monitoring.  May need to start on a low-dose  antidepressant to help his mood.   #  6 - MILD MALNUTRITION:  The patient has not had a significant appetite  over the last couple of months and is indicative of some mildly low  albumin.  Need to encourage better eating and maximize his nutrition.   DISPOSITION:  The patient will be discharged to [facility] in the morning for more intensive rehabilitation.  Dr.  Gerilyn Pilgrim is to see the patient this evening, to see if there is anything  else to add to his regimen.  If necessary, have spoken with Annie Main,  N.P., and his case has been run by Dr. Pearlean Brownie, if we need any further  input from a neurologist for his condition.      Catalina Pizza, M.D.  Electronically Signed     ZH/MEDQ  D:  09/09/2006  T:  09/09/2006  Job:  [id number]

## 2010-08-14 NOTE — Assessment & Plan Note (Signed)
HISTORY OF PRESENT ILLNESS:  [name] is here in follow up of his left  parietal stroke.  He was discharged to [facility].  He is  actually doing fairly well with improved gait and balance.  They are  encouraging the use of a walker when he is up by himself.  He can walk  with contact guard assist and supervision, otherwise.  Daughter is some  happy with the amount of therapy he is getting.  She questions whether  he can come home.  The patient has been upgraded to regular liquids with  chopped foods.  Mood has been good.  The patient denies pain.  The  patient was taken home with daughter 1-2 weekends ago and did fairly  well.  He did have some issues with stairs initially but then seemed to  improve after practice there.   REVIEW OF SYSTEMS:  Notable for the above.  No other issues are noted  today with full review in the Health and History section.   SOCIAL HISTORY:  The patient is widowed and would come home with  daughter upon discharge from skilled nursing facility with intermittent  check ins at home.   PHYSICAL EXAMINATION:  VITAL SIGNS:  Blood pressure 160/71, repeat check  128/60, pulse 51, respirations 20, sating 99% on room air.  GENERAL:  He is generally alert and oriented x3.  Needs extra time for  word finding.  He does get a bit frustrated and can get stuck at times.  When he is looking for a work, he is automatic.  Speech is excellent and  clear.  He is generally dysarthric and sometimes hypophonic.  Right hand  and leg have good strength at 4+ to 5/5. He does lack sensory function  at 1/2 with decreased proprioception noted and he had decreased fluidity  of movement of the upper and lower extremity.  Right leg tends to scuff  the floor, and he tends to hyperextend the knee with his lack of  position sense there.  No abnormal reflexes were seen.  No excess tone  was appreciated in the right side.  Cognition appeared to be  appropriate, and the patient displayed  good insight and awareness when  we were able to get through language difficulties.  HEART:  Regular.  CHEST:  Clear.  ABDOMEN:  Soft, nontender.    Weight is stable.   ASSESSMENT:  1. Left parietal/subcortical stroke.  2. Aphagia.  3 . Dysphagia.  1. Atrial fibrillation.  2. Hypertension.   PLAN:  1. I think the patient is ready to come home.  I would recommend      initial 24 hour supervision for at least a few days to see how he      fairs.  He is already able to use the cell phone and call his      daughter if he has problems.  I think he should use his walker at      home and then slowly transition to a cane and off as the days pass      by.  I would recommend Home Health therapy upon discharge to home      with transition to outpatient therapy eventually.  2. Coumadin as recommended by Cardiology and Neurology.  3. I will see him back in about three months time.  In general, he has      done really nicely and should continue to improve.      Lamonte Richer.  Riley Kill, M.D.  Electronically Signed     ZTS/MedQ  D:  11/11/2006 11:28:43  T:  11/12/2006 12:23:04  Job #:  [id number]   cc:   Catalina Pizza, M.D.  Fax: [phone]

## 2011-01-16 LAB — PROTIME-INR
INR: 1.9 — ABNORMAL HIGH
INR: 2.4 — ABNORMAL HIGH
INR: 2.7 — ABNORMAL HIGH
Prothrombin Time: 22.2 — ABNORMAL HIGH
Prothrombin Time: 23.6 — ABNORMAL HIGH
Prothrombin Time: 27.6 — ABNORMAL HIGH
Prothrombin Time: 30.4 — ABNORMAL HIGH

## 2011-01-16 LAB — BASIC METABOLIC PANEL
BUN: 30 — ABNORMAL HIGH
Calcium: 8.4
Calcium: 8.7
Chloride: 105
Creatinine, Ser: 1.4
GFR calc Af Amer: 46 — ABNORMAL LOW
GFR calc non Af Amer: 38 — ABNORMAL LOW
GFR calc non Af Amer: 48 — ABNORMAL LOW
Glucose, Bld: 103 — ABNORMAL HIGH
Glucose, Bld: 90
Potassium: 4.2
Potassium: 4.5
Sodium: 137
Sodium: 138

## 2011-01-16 LAB — CBC
HCT: 33.5 — ABNORMAL LOW
HCT: 36.1 — ABNORMAL LOW
Hemoglobin: 11.3 — ABNORMAL LOW
MCHC: 33.6
MCV: 93.2
MCV: 93.3
Platelets: 301
RBC: 3.87 — ABNORMAL LOW
RDW: 14.2 — ABNORMAL HIGH

## 2011-01-17 LAB — CBC
HCT: 32.1 — ABNORMAL LOW
HCT: 34.8 — ABNORMAL LOW
HCT: 35.3 — ABNORMAL LOW
HCT: 36.7 — ABNORMAL LOW
Hemoglobin: 11.3 — ABNORMAL LOW
Hemoglobin: 12.2 — ABNORMAL LOW
Hemoglobin: 12.3 — ABNORMAL LOW
MCHC: 33.3
MCHC: 34.4
MCV: 92.8
MCV: 93.1
MCV: 93.5
Platelets: 270
Platelets: 270
Platelets: 280
Platelets: 284
RBC: 3.6 — ABNORMAL LOW
RBC: 3.79 — ABNORMAL LOW
RDW: 14.1 — ABNORMAL HIGH
RDW: 14.3 — ABNORMAL HIGH
RDW: 14.5 — ABNORMAL HIGH
RDW: 14.8 — ABNORMAL HIGH
WBC: 7.4
WBC: 8.8
WBC: 8.8

## 2011-01-17 LAB — PROTIME-INR
INR: 1.1
INR: 1.2
INR: 2.1 — ABNORMAL HIGH
INR: 2.5 — ABNORMAL HIGH
Prothrombin Time: 14.9
Prothrombin Time: 15.6 — ABNORMAL HIGH
Prothrombin Time: 17.2 — ABNORMAL HIGH
Prothrombin Time: 27.4 — ABNORMAL HIGH
Prothrombin Time: 28.7 — ABNORMAL HIGH

## 2011-01-17 LAB — BASIC METABOLIC PANEL
BUN: 16
CO2: 25
Chloride: 108
Creatinine, Ser: 1.18
Creatinine, Ser: 1.34
GFR calc Af Amer: >60
GFR calc non Af Amer: 58 — ABNORMAL LOW
Glucose, Bld: 73
Potassium: 3.3 — ABNORMAL LOW
Sodium: 140

## 2011-01-17 LAB — DIFFERENTIAL
Basophils Absolute: 0
Basophils Absolute: 0.1
Basophils Absolute: 0.1
Basophils Relative: 1
Eosinophils Absolute: 0.2
Eosinophils Absolute: 0.2
Eosinophils Relative: 3
Lymphocytes Relative: 15
Lymphocytes Relative: 16
Lymphocytes Relative: 23
Lymphs Abs: 1.3
Lymphs Abs: 1.3
Monocytes Absolute: 0.9 — ABNORMAL HIGH
Monocytes Absolute: 0.9 — ABNORMAL HIGH
Monocytes Relative: 11
Neutro Abs: 4.9
Neutro Abs: 6.1
Neutro Abs: 6.3
Neutrophils Relative %: 62
Neutrophils Relative %: 69
Neutrophils Relative %: 71
Neutrophils Relative %: 72

## 2011-01-17 LAB — HEPARIN LEVEL (UNFRACTIONATED)
Heparin Unfractionated: 0.34
Heparin Unfractionated: 0.38
Heparin Unfractionated: 0.38
Heparin Unfractionated: 0.45

## 2011-01-17 LAB — COMPREHENSIVE METABOLIC PANEL
ALT: <8
AST: 18
Albumin: 3.2 — ABNORMAL LOW
Albumin: 3.3 — ABNORMAL LOW
Alkaline Phosphatase: 69
BUN: 16
BUN: 29 — ABNORMAL HIGH
CO2: 27
Calcium: 8.7
Calcium: 8.9
Chloride: 110
Creatinine, Ser: 1.31
GFR calc Af Amer: >60
GFR calc non Af Amer: 52 — ABNORMAL LOW
Glucose, Bld: 122 — ABNORMAL HIGH
Glucose, Bld: 84
Potassium: 3.7
Sodium: 142
Total Bilirubin: 0.5
Total Protein: 6.7
Total Protein: 6.7

## 2011-01-17 LAB — APTT: aPTT: >200

## 2011-01-17 LAB — POCT CARDIAC MARKERS
CKMB, poc: <1 — ABNORMAL LOW
Myoglobin, poc: 57.9

## 2011-04-25 ENCOUNTER — Ambulatory Visit (HOSPITAL_COMMUNITY)
Admission: RE | Admit: 2011-04-25 | Discharge: 2011-04-25 | Disposition: A | Discharge status: Home / Self Care | Payer: Medicare HMO | Source: Ambulatory Visit | Attending: Internal Medicine | Admitting: Internal Medicine

## 2011-04-25 ENCOUNTER — Other Ambulatory Visit (HOSPITAL_COMMUNITY): Payer: Self-pay | Admitting: Internal Medicine

## 2011-04-25 DIAGNOSIS — M899 Disorder of bone, unspecified: Secondary | ICD-10-CM | POA: Insufficient documentation

## 2011-04-25 DIAGNOSIS — M25559 Pain in unspecified hip: Secondary | ICD-10-CM | POA: Insufficient documentation

## 2011-04-25 DIAGNOSIS — M25551 Pain in right hip: Secondary | ICD-10-CM

## 2011-04-25 DIAGNOSIS — M949 Disorder of cartilage, unspecified: Secondary | ICD-10-CM | POA: Insufficient documentation

## 2011-06-10 ENCOUNTER — Other Ambulatory Visit (HOSPITAL_COMMUNITY): Payer: Self-pay | Admitting: Internal Medicine

## 2011-06-10 ENCOUNTER — Ambulatory Visit (HOSPITAL_COMMUNITY)
Admission: RE | Admit: 2011-06-10 | Discharge: 2011-06-10 | Disposition: A | Discharge status: Home / Self Care | Payer: Medicare HMO | Source: Ambulatory Visit | Attending: Internal Medicine | Admitting: Internal Medicine

## 2011-06-10 DIAGNOSIS — R102 Pelvic and perineal pain: Secondary | ICD-10-CM

## 2011-06-10 DIAGNOSIS — R0781 Pleurodynia: Secondary | ICD-10-CM

## 2011-06-10 DIAGNOSIS — M19029 Primary osteoarthritis, unspecified elbow: Secondary | ICD-10-CM | POA: Insufficient documentation

## 2011-06-10 DIAGNOSIS — R911 Solitary pulmonary nodule: Secondary | ICD-10-CM | POA: Insufficient documentation

## 2011-06-14 ENCOUNTER — Inpatient Hospital Stay (HOSPITAL_COMMUNITY)
Admission: EM | Admit: 2011-06-14 | Discharge: 2011-06-19 | DRG: 194 | Disposition: A | Discharge status: Skilled Nursing Facility | Payer: Medicare HMO | Attending: Internal Medicine | Admitting: Internal Medicine

## 2011-06-14 ENCOUNTER — Emergency Department (HOSPITAL_COMMUNITY): Payer: Medicare HMO

## 2011-06-14 ENCOUNTER — Encounter (HOSPITAL_COMMUNITY): Payer: Self-pay | Admitting: Emergency Medicine

## 2011-06-14 ENCOUNTER — Other Ambulatory Visit: Payer: Self-pay

## 2011-06-14 ENCOUNTER — Inpatient Hospital Stay (HOSPITAL_COMMUNITY): Payer: Medicare HMO

## 2011-06-14 DIAGNOSIS — I679 Cerebrovascular disease, unspecified: Secondary | ICD-10-CM | POA: Diagnosis present

## 2011-06-14 DIAGNOSIS — R531 Weakness: Secondary | ICD-10-CM | POA: Diagnosis present

## 2011-06-14 DIAGNOSIS — L89309 Pressure ulcer of unspecified buttock, unspecified stage: Secondary | ICD-10-CM

## 2011-06-14 DIAGNOSIS — R5381 Other malaise: Secondary | ICD-10-CM | POA: Diagnosis present

## 2011-06-14 DIAGNOSIS — I10 Essential (primary) hypertension: Secondary | ICD-10-CM | POA: Diagnosis present

## 2011-06-14 DIAGNOSIS — E860 Dehydration: Secondary | ICD-10-CM | POA: Diagnosis present

## 2011-06-14 DIAGNOSIS — E871 Hypo-osmolality and hyponatremia: Secondary | ICD-10-CM | POA: Diagnosis present

## 2011-06-14 DIAGNOSIS — I509 Heart failure, unspecified: Secondary | ICD-10-CM

## 2011-06-14 DIAGNOSIS — J189 Pneumonia, unspecified organism: Secondary | ICD-10-CM | POA: Diagnosis present

## 2011-06-14 DIAGNOSIS — E869 Volume depletion, unspecified: Secondary | ICD-10-CM | POA: Diagnosis present

## 2011-06-14 DIAGNOSIS — Z8673 Personal history of transient ischemic attack (TIA), and cerebral infarction without residual deficits: Secondary | ICD-10-CM

## 2011-06-14 DIAGNOSIS — Z66 Do not resuscitate: Secondary | ICD-10-CM | POA: Diagnosis present

## 2011-06-14 DIAGNOSIS — I4891 Unspecified atrial fibrillation: Secondary | ICD-10-CM | POA: Diagnosis present

## 2011-06-14 DIAGNOSIS — F29 Unspecified psychosis not due to a substance or known physiological condition: Secondary | ICD-10-CM | POA: Diagnosis present

## 2011-06-14 DIAGNOSIS — E875 Hyperkalemia: Secondary | ICD-10-CM | POA: Diagnosis present

## 2011-06-14 DIAGNOSIS — Z7901 Long term (current) use of anticoagulants: Secondary | ICD-10-CM

## 2011-06-14 DIAGNOSIS — Z79899 Other long term (current) drug therapy: Secondary | ICD-10-CM

## 2011-06-14 HISTORY — DX: Cerebral infarction, unspecified: I63.9

## 2011-06-14 HISTORY — DX: Essential (primary) hypertension: I10

## 2011-06-14 LAB — CBC
HCT: 37.6 % — ABNORMAL LOW (ref 39.0–52.0)
Hemoglobin: 13 g/dL (ref 13.0–17.0)
RBC: 4.05 MIL/uL — ABNORMAL LOW (ref 4.22–5.81)
WBC: 10.2 10*3/uL (ref 4.0–10.5)

## 2011-06-14 LAB — URINALYSIS, ROUTINE W REFLEX MICROSCOPIC
Glucose, UA: NEGATIVE mg/dL
Ketones, ur: NEGATIVE mg/dL
Leukocytes, UA: NEGATIVE
pH: 6 (ref 5.0–8.0)

## 2011-06-14 LAB — CARDIAC PANEL(CRET KIN+CKTOT+MB+TROPI)
CK, MB: 3.9 ng/mL (ref 0.3–4.0)
Relative Index: INVALID (ref 0.0–2.5)
Troponin I: <0.3 ng/mL (ref <0.30)

## 2011-06-14 LAB — DIFFERENTIAL
Lymphocytes Relative: 14 % (ref 12–46)
Lymphs Abs: 1.4 10*3/uL (ref 0.7–4.0)
Monocytes Absolute: 1.3 10*3/uL — ABNORMAL HIGH (ref 0.1–1.0)
Monocytes Relative: 13 % — ABNORMAL HIGH (ref 3–12)
Neutro Abs: 7.2 10*3/uL (ref 1.7–7.7)

## 2011-06-14 LAB — COMPREHENSIVE METABOLIC PANEL
Alkaline Phosphatase: 96 U/L (ref 39–117)
BUN: 33 mg/dL — ABNORMAL HIGH (ref 6–23)
CO2: 27 mEq/L (ref 19–32)
Chloride: 93 mEq/L — ABNORMAL LOW (ref 96–112)
Creatinine, Ser: 1.32 mg/dL (ref 0.50–1.35)
GFR calc non Af Amer: 44 mL/min — ABNORMAL LOW (ref >90)
Total Bilirubin: 0.4 mg/dL (ref 0.3–1.2)

## 2011-06-14 LAB — PROTIME-INR: Prothrombin Time: 25.4 seconds — ABNORMAL HIGH (ref 11.6–15.2)

## 2011-06-14 MED ORDER — AZITHROMYCIN 500 MG IV SOLR: 500.0000 mg | INTRAVENOUS | Status: DC

## 2011-06-14 MED ORDER — GUAIFENESIN ER 600 MG PO TB12
1200.0000 mg | ORAL_TABLET | Freq: Two times a day (BID) | ORAL | Status: DC
Start: 2011-06-14 — End: 2011-06-19
  Administered 2011-06-14 – 2011-06-19 (×10): 1200 mg via ORAL
  Filled 2011-06-14 (×5): qty 2
  Filled 2011-06-14: qty 1
  Filled 2011-06-14 (×2): qty 2
  Filled 2011-06-14: qty 1
  Filled 2011-06-14 (×2): qty 2

## 2011-06-14 MED ORDER — ENOXAPARIN SODIUM 40 MG/0.4ML ~~LOC~~ SOLN
40.0000 mg | SUBCUTANEOUS | Status: DC
  Administered 2011-06-14: 40 mg via SUBCUTANEOUS
  Filled 2011-06-14: qty 0.4

## 2011-06-14 MED ORDER — FUROSEMIDE 10 MG/ML IJ SOLN: 40.0000 mg | Freq: Once | INTRAMUSCULAR | Status: DC

## 2011-06-14 MED ORDER — HYDROCODONE-ACETAMINOPHEN 5-325 MG PO TABS
1.0000 | ORAL_TABLET | Freq: Four times a day (QID) | ORAL | Status: DC | PRN
  Administered 2011-06-15 – 2011-06-18 (×8): 1 via ORAL
  Filled 2011-06-14 (×8): qty 1

## 2011-06-14 MED ORDER — FUROSEMIDE 10 MG/ML IJ SOLN
40.0000 mg | Freq: Once | INTRAMUSCULAR | Status: AC
  Administered 2011-06-14: 40 mg via INTRAVENOUS
  Filled 2011-06-14: qty 4

## 2011-06-14 MED ORDER — DEXTROSE 5 % IV SOLN
1.0000 g | Freq: Once | INTRAVENOUS | Status: AC
  Administered 2011-06-14: 1 g via INTRAVENOUS
  Filled 2011-06-14: qty 10

## 2011-06-14 MED ORDER — SODIUM CHLORIDE 0.9 % IV SOLN: INTRAVENOUS | Status: DC

## 2011-06-14 MED ORDER — CEFTRIAXONE SODIUM 1 G IJ SOLR: 1.0000 g | Freq: Once | INTRAMUSCULAR | Status: DC

## 2011-06-14 MED ORDER — NITROGLYCERIN 2 % TD OINT
1.0000 [in_us] | TOPICAL_OINTMENT | Freq: Once | TRANSDERMAL | Status: AC
  Administered 2011-06-14: 1 [in_us] via TOPICAL
  Filled 2011-06-14: qty 1

## 2011-06-14 MED ORDER — WARFARIN SODIUM 2.5 MG PO TABS: 2.5000 mg | ORAL_TABLET | Freq: Every day | ORAL | Status: DC

## 2011-06-14 MED ORDER — DEXTROSE 5 % IV SOLN
1.0000 g | INTRAVENOUS | Status: DC
  Administered 2011-06-15: 1 g via INTRAVENOUS
  Filled 2011-06-14 (×2): qty 10

## 2011-06-14 MED ORDER — SODIUM CHLORIDE 0.9 % IV SOLN
INTRAVENOUS | Status: DC
  Administered 2011-06-15: 05:00:00 via INTRAVENOUS
  Administered 2011-06-15: 1000 mL via INTRAVENOUS
  Administered 2011-06-16: 09:00:00 via INTRAVENOUS

## 2011-06-14 MED ORDER — WARFARIN - PHARMACIST DOSING INPATIENT
Freq: Every day | Status: DC
  Administered 2011-06-17: 17:00:00

## 2011-06-14 MED ORDER — SODIUM CHLORIDE 0.9 % IV SOLN
INTRAVENOUS | Status: DC
  Administered 2011-06-14: 16:00:00 via INTRAVENOUS

## 2011-06-14 MED ORDER — AMLODIPINE BESYLATE 5 MG PO TABS
5.0000 mg | ORAL_TABLET | Freq: Every day | ORAL | Status: DC
  Administered 2011-06-15 – 2011-06-19 (×5): 5 mg via ORAL
  Filled 2011-06-14 (×5): qty 1

## 2011-06-14 MED ORDER — DEXTROSE 5 % IV SOLN
500.0000 mg | INTRAVENOUS | Status: DC
  Administered 2011-06-14 – 2011-06-15 (×2): 500 mg via INTRAVENOUS
  Filled 2011-06-14 (×3): qty 500

## 2011-06-14 MED ORDER — CELECOXIB 100 MG PO CAPS
200.0000 mg | ORAL_CAPSULE | Freq: Every day | ORAL | Status: DC
  Administered 2011-06-15 – 2011-06-19 (×5): 200 mg via ORAL
  Filled 2011-06-14: qty 1
  Filled 2011-06-14 (×3): qty 2
  Filled 2011-06-14: qty 1
  Filled 2011-06-14 (×2): qty 2
  Filled 2011-06-14: qty 1

## 2011-06-14 MED ORDER — WARFARIN SODIUM 2.5 MG PO TABS
2.5000 mg | ORAL_TABLET | Freq: Once | ORAL | Status: AC
  Administered 2011-06-14: 2.5 mg via ORAL
  Filled 2011-06-14: qty 1

## 2011-06-14 MED ORDER — PSYLLIUM 0.52 G PO CAPS
0.5200 g | ORAL_CAPSULE | Freq: Every day | ORAL | Status: DC
Start: 2011-06-15 — End: 2011-06-15
  Filled 2011-06-14 (×3): qty 1

## 2011-06-14 NOTE — ED Notes (Signed)
Per EMS pt family states pt has been having weakness since a fall 3 weeks ago. Per family pt has not been eating well.

## 2011-06-14 NOTE — H&P (Signed)
PCP:   Dwana Melena, MD, MD   Chief Complaint:  Generalized weakness  HPI: This is a 78 y/o gentleman who has a history of stroke, but up until a few weeks ago, was living independently at home.  He suffered a few weeks ago, so his daughter had arranged for home PT to assist in strength training.  He had been doing fairly well with this, but then over the last few days he started to become increasingly weak. Patient's po intake has been poor and it has been difficult for him to get out of bed.  There has not been a reported fever, cough or shortness of breath.  He generally feels unwell  Work up in the ED revealed dehydration per labs and cxr with a possible pneumonia.  He has been referred for admission.  Allergies:  No Known Allergies    Past Medical History  Diagnosis Date  . Hypertension   . Stroke     No past surgical history on file.  Prior to Admission medications   Medication Sig Start Date End Date Taking? Authorizing Provider  aliskiren (TEKTURNA) 150 MG tablet Take 150 mg by mouth daily.   Yes Historical Provider, MD  amLODipine (NORVASC) 5 MG tablet Take 5 mg by mouth daily.   Yes Historical Provider, MD  celecoxib (CELEBREX) 200 MG capsule Take 200 mg by mouth daily.   Yes Historical Provider, MD  furosemide (LASIX) 20 MG tablet Take 20 mg by mouth daily as needed. For swelling and shortness of breath   Yes Historical Provider, MD  HYDROcodone-acetaminophen (NORCO) 5-325 MG per tablet Take 1 tablet by mouth every 6 (six) hours as needed. pain   Yes Historical Provider, MD  psyllium (FIBER THERAPY) 0.52 G capsule Take 0.52 g by mouth daily.   Yes Historical Provider, MD  warfarin (COUMADIN) 5 MG tablet Take 2.5-5 mg by mouth daily. Patient takes 1 tablet(5mg ) on Monday,Tuesday,Thursday,Saturday,Sunday.. And 1/2 tablet(2.5mg ) on Wednesday,Friday   Yes Historical Provider, MD    Social History:  does not have a smoking history on file. He does not have any smokeless tobacco  history on file. He reports that he does not drink alcohol or use illicit drugs.  No family history on file.  Review of Systems: Positives in bold Constitutional: Denies fever, chills, diaphoresis, appetite change and fatigue.  HEENT: Denies photophobia, eye pain, redness, hearing loss, ear pain, congestion, sore throat, rhinorrhea, sneezing, mouth sores, trouble swallowing, neck pain, neck stiffness and tinnitus.   Respiratory: Denies SOB, DOE, cough, chest tightness,  and wheezing.   Cardiovascular: Denies chest pain, palpitations and leg swelling.  Gastrointestinal: Denies nausea, vomiting, abdominal pain, diarrhea, constipation, blood in stool and abdominal distention.  Genitourinary: Denies dysuria, urgency, frequency, hematuria, flank pain and difficulty urinating.  Musculoskeletal: Denies myalgias, back pain, joint swelling, arthralgias and gait problem.  Skin: Denies pallor, rash and wound.  Neurological: Denies dizziness, seizures, syncope, weakness, light-headedness, numbness and headaches.  Hematological: Denies adenopathy. Easy bruising, personal or family bleeding history  Psychiatric/Behavioral: Denies suicidal ideation, mood changes, confusion, nervousness, sleep disturbance and agitation   Physical Exam: Blood pressure 127/52, pulse 49, temperature 98.7 F (37.1 C), temperature source Oral, resp. rate 20, SpO2 98.00%. General: elderly male, NAD, mildly confused, cachectic HEENT:  AT, mucous membranes are dry Chest: mild rhonchi at bases, otherwise clear Cardiac: S1, S2, RRR Abd: soft, NT, BS+ Ext: no edema b/l Neuro: grossly intact, nonfocal  Labs on Admission:  Results for orders placed during the hospital  encounter of 06/14/11 (from the past 48 hour(s))  URINALYSIS, ROUTINE W REFLEX MICROSCOPIC     Status: Normal   Collection Time   06/14/11  3:41 PM      Component Value Range Comment   Color, Urine YELLOW  YELLOW     APPearance CLEAR  CLEAR     Specific  Gravity, Urine 1.010  1.005 - 1.030     pH 6.0  5.0 - 8.0     Glucose, UA NEGATIVE  NEGATIVE (mg/dL)    Hgb urine dipstick NEGATIVE  NEGATIVE     Bilirubin Urine NEGATIVE  NEGATIVE     Ketones, ur NEGATIVE  NEGATIVE (mg/dL)    Protein, ur NEGATIVE  NEGATIVE (mg/dL)    Urobilinogen, UA 0.2  0.0 - 1.0 (mg/dL)    Nitrite NEGATIVE  NEGATIVE     Leukocytes, UA NEGATIVE  NEGATIVE  MICROSCOPIC NOT DONE ON URINES WITH NEGATIVE PROTEIN, BLOOD, LEUKOCYTES, NITRITE, OR GLUCOSE <1000 mg/dL.  CBC     Status: Abnormal   Collection Time   06/14/11  4:28 PM      Component Value Range Comment   WBC 10.2  4.0 - 10.5 (K/uL)    RBC 4.05 (*) 4.22 - 5.81 (MIL/uL)    Hemoglobin 13.0  13.0 - 17.0 (g/dL)    HCT 16.1 (*) 09.6 - 52.0 (%)    MCV 92.8  78.0 - 100.0 (fL)    MCH 32.1  26.0 - 34.0 (pg)    MCHC 34.6  30.0 - 36.0 (g/dL)    RDW 04.5  40.9 - 81.1 (%)    Platelets 398  150 - 400 (K/uL)   DIFFERENTIAL     Status: Abnormal   Collection Time   06/14/11  4:28 PM      Component Value Range Comment   Neutrophils Relative 71  43 - 77 (%)    Neutro Abs 7.2  1.7 - 7.7 (K/uL)    Lymphocytes Relative 14  12 - 46 (%)    Lymphs Abs 1.4  0.7 - 4.0 (K/uL)    Monocytes Relative 13 (*) 3 - 12 (%)    Monocytes Absolute 1.3 (*) 0.1 - 1.0 (K/uL)    Eosinophils Relative 3  0 - 5 (%)    Eosinophils Absolute 0.3  0.0 - 0.7 (K/uL)    Basophils Relative 1  0 - 1 (%)    Basophils Absolute 0.1  0.0 - 0.1 (K/uL)   COMPREHENSIVE METABOLIC PANEL     Status: Abnormal   Collection Time   06/14/11  4:28 PM      Component Value Range Comment   Sodium 127 (*) 135 - 145 (mEq/L)    Potassium 5.3 (*) 3.5 - 5.1 (mEq/L)    Chloride 93 (*) 96 - 112 (mEq/L)    CO2 27  19 - 32 (mEq/L)    Glucose, Bld 96  70 - 99 (mg/dL)    BUN 33 (*) 6 - 23 (mg/dL)    Creatinine, Ser 9.14  0.50 - 1.35 (mg/dL)    Calcium 9.4  8.4 - 10.5 (mg/dL)    Total Protein 7.0  6.0 - 8.3 (g/dL)    Albumin 3.7  3.5 - 5.2 (g/dL)    AST 18  0 - 37 (U/L)    ALT  11  0 - 53 (U/L)    Alkaline Phosphatase 96  39 - 117 (U/L)    Total Bilirubin 0.4  0.3 - 1.2 (mg/dL)    GFR calc  non Af Amer 44 (*) >90 (mL/min)    GFR calc Af Amer 52 (*) >90 (mL/min)   CARDIAC PANEL(CRET KIN+CKTOT+MB+TROPI)     Status: Normal   Collection Time   06/14/11  4:28 PM      Component Value Range Comment   Total CK 57  7 - 232 (U/L)    CK, MB 3.9  0.3 - 4.0 (ng/mL)    Troponin I <0.30  <0.30 (ng/mL)    Relative Index RELATIVE INDEX IS INVALID  0.0 - 2.5    PRO B NATRIURETIC PEPTIDE     Status: Abnormal   Collection Time   06/14/11  4:28 PM      Component Value Range Comment   Pro B Natriuretic peptide (BNP) 2846.0 (*) 0 - 450 (pg/mL)     Radiological Exams on Admission: Dg Chest 1 View  06/14/2011  *RADIOLOGY REPORT*  Clinical Data: Weakness.  Anorexia.  Hypertension.  Previous stroke.  CHEST - 1 VIEW  Comparison: 06/10/2011  Findings: New opacity is seen in the lateral left lung base, suspicious for pneumonia.  Right lung remains clear.  Cardiomegaly and chronic pulmonary venous hypertension are stable.  No evidence of pulmonary edema.  Hilar and mediastinal contours remain stable.  IMPRESSION:  1.  New opacity in left lateral lung base, suspicious for pneumonia. 2.  Stable cardiomegaly and chronic pulmonary venous hypertension.  Original Report Authenticated By: Danae Orleans, M.D.    Assessment/Plan Active Problems:  Community acquired pneumonia  Generalized weakness  Atrial fibrillation  Chronic anticoagulation  History of stroke  Dehydration Hyponatremia Hyperkalemia   Plan:  Patient will be admitted to a medsurg bed. He will be treated for CAP with rocephin and azithro We will provide gentle hydration and follow his volume status closely His electrolyte abnormalities are likely related to volume depletion His confusion is also likely related to dehydration, but since he is on coumadin and has had a recent fall, we will do CT brain for further  evaluation. We will ask PT to see patient, and he will likely need SNF placement for rehab.  Patient is a DNR, this was confirmed with the family.  Time Spent on Admission:  Takia Runyon [facility] Pager: [phone] 06/14/2011, 6:48 PM

## 2011-06-14 NOTE — ED Provider Notes (Signed)
History   This chart was scribed for William Cooper III, MD by [name] . The patient was seen in room [facility]2/[facility] and the patient's care was started at 3:38pm.   CSN: [id number]  Arrival date & time 06/14/11  1510   First MD Initiated Contact with Patient 06/14/11 1530      Chief Complaint  Patient presents with  . Weakness  . Altered Mental Status    (Consider location/radiation/quality/duration/timing/severity/associated sxs/prior treatment) The history is provided by the patient and a relative.   [name] is a 78 y.o. male who presents to the Emergency Department complaining of constant, moderate generalized weakness with associated disorientation, decreased activity, and decreased appetite for the past couple of weeks. Daughter also reports that the patient has a bed sore on his sacral area. Daughter reports that the patient has been having generalized weakness since a fall 3 weeks ago, and that his condition is worsening. Patient reports no pain. Daughter reports a h/o hypertension and stroke. The patient currently lives with his daughter.    Past Medical History  Diagnosis Date  . Hypertension   . Stroke     No past surgical history on file.  No family history on file.  History  Substance Use Topics  . Smoking status: Not on file  . Smokeless tobacco: Not on file  . Alcohol Use: No      Review of Systems  Constitutional: Positive for activity change and appetite change. Negative for fever.  Skin: Positive for wound (sacral area bed sore. ).  Neurological: Positive for weakness.  All other systems reviewed and are negative.     Allergies  Review of patient's allergies indicates no known allergies.  Home Medications   Current Outpatient Rx  Name Route Sig Dispense Refill  . ALISKIREN FUMARATE 150 MG PO TABS Oral Take 150 mg by mouth daily.    Marland Kitchen AMLODIPINE BESYLATE 5 MG PO TABS Oral Take 5 mg by mouth daily.    . CELECOXIB 200 MG PO  CAPS Oral Take 200 mg by mouth daily.    . FUROSEMIDE 20 MG PO TABS Oral Take 20 mg by mouth daily as needed. For swelling and shortness of breath    . HYDROCODONE-ACETAMINOPHEN 5-325 MG PO TABS Oral Take 1 tablet by mouth every 6 (six) hours as needed. pain    . PSYLLIUM 0.52 G PO CAPS Oral Take 0.52 g by mouth daily.    . WARFARIN SODIUM 5 MG PO TABS Oral Take 2.5-5 mg by mouth daily. Patient takes 1 tablet(5mg ) on Monday,Tuesday,Thursday,Saturday,Sunday.. And 1/2 tablet(2.5mg ) on Wednesday,Friday      BP 127/52  Pulse 49  Temp(Src) 98.7 F (37.1 C) (Oral)  Resp 20  SpO2 98%  Physical Exam  Nursing note and vitals reviewed. Constitutional: He is oriented to person, place, and time. He appears well-developed and well-nourished. No distress.  HENT:  Head: Normocephalic and atraumatic.       Dry mouth.   Eyes: EOM are normal. Pupils are equal, round, and reactive to light.  Neck: Normal range of motion. Neck supple. No tracheal deviation present.  Cardiovascular: Normal rate, regular rhythm and normal heart sounds.  Exam reveals no gallop and no friction rub.   No murmur heard. Pulmonary/Chest: Effort normal and breath sounds normal. No respiratory distress. He has no wheezes. He has no rales.  Abdominal: Soft. Bowel sounds are normal. He exhibits no distension. There is no tenderness.  Musculoskeletal: Normal range of motion. He  exhibits no edema.       Residual right-sided weakness from an old stroke.   Neurological: He is alert and oriented to person, place, and time. No sensory deficit.  Skin: Skin is warm and dry.       Redness of the skin at the sacrum. Note a 1 cm in diameter, mild ulcer.   Psychiatric: He has a normal mood and affect. His behavior is normal.    ED Course  Procedures (including critical care time)  DIAGNOSTIC STUDIES: Oxygen Saturation is 98% on room air, normal by my interpretation.    COORDINATION OF CARE:  1545: Discussed the planned course of  treatment with the patient and family.  1600: Medication Orders: 0.9% sodium chloride infusion-continuous.  1730: Recheck: Informed family of the lab and imaging results. Family informed of planned admission into the hospital.  1735: Consultation:     Date: 06/14/2011  Rate: 50  Rhythm: atrial fibrillation  QRS Axis: left  Intervals: normal QRS:  Poor R wave progression in the precordial leads suggests possible old anterior myocardial infarction.  ST/T Wave abnormalities: normal  Conduction Disutrbances:left anterior fascicular block  Narrative Interpretation: Abnormal EKG.  Old EKG Reviewed: unchanged  Results for orders placed during the hospital encounter of 06/14/11  URINALYSIS, ROUTINE W REFLEX MICROSCOPIC      Component Value Range   Color, Urine YELLOW  YELLOW    APPearance CLEAR  CLEAR    Specific Gravity, Urine 1.010  1.005 - 1.030    pH 6.0  5.0 - 8.0    Glucose, UA NEGATIVE  NEGATIVE (mg/dL)   Hgb urine dipstick NEGATIVE  NEGATIVE    Bilirubin Urine NEGATIVE  NEGATIVE    Ketones, ur NEGATIVE  NEGATIVE (mg/dL)   Protein, ur NEGATIVE  NEGATIVE (mg/dL)   Urobilinogen, UA 0.2  0.0 - 1.0 (mg/dL)   Nitrite NEGATIVE  NEGATIVE    Leukocytes, UA NEGATIVE  NEGATIVE   CBC      Component Value Range   WBC 10.2  4.0 - 10.5 (K/uL)   RBC 4.05 (*) 4.22 - 5.81 (MIL/uL)   Hemoglobin 13.0  13.0 - 17.0 (g/dL)   HCT 16.1 (*) 09.6 - 52.0 (%)   MCV 92.8  78.0 - 100.0 (fL)   MCH 32.1  26.0 - 34.0 (pg)   MCHC 34.6  30.0 - 36.0 (g/dL)   RDW 04.5  40.9 - 81.1 (%)   Platelets 398  150 - 400 (K/uL)  DIFFERENTIAL      Component Value Range   Neutrophils Relative 71  43 - 77 (%)   Neutro Abs 7.2  1.7 - 7.7 (K/uL)   Lymphocytes Relative 14  12 - 46 (%)   Lymphs Abs 1.4  0.7 - 4.0 (K/uL)   Monocytes Relative 13 (*) 3 - 12 (%)   Monocytes Absolute 1.3 (*) 0.1 - 1.0 (K/uL)   Eosinophils Relative 3  0 - 5 (%)   Eosinophils Absolute 0.3  0.0 - 0.7 (K/uL)   Basophils Relative 1  0 - 1 (%)     Basophils Absolute 0.1  0.0 - 0.1 (K/uL)  COMPREHENSIVE METABOLIC PANEL      Component Value Range   Sodium 127 (*) 135 - 145 (mEq/L)   Potassium 5.3 (*) 3.5 - 5.1 (mEq/L)   Chloride 93 (*) 96 - 112 (mEq/L)   CO2 27  19 - 32 (mEq/L)   Glucose, Bld 96  70 - 99 (mg/dL)   BUN 33 (*) 6 -  23 (mg/dL)   Creatinine, Ser 1.61  0.50 - 1.35 (mg/dL)   Calcium 9.4  8.4 - 09.6 (mg/dL)   Total Protein 7.0  6.0 - 8.3 (g/dL)   Albumin 3.7  3.5 - 5.2 (g/dL)   AST 18  0 - 37 (U/L)   ALT 11  0 - 53 (U/L)   Alkaline Phosphatase 96  39 - 117 (U/L)   Total Bilirubin 0.4  0.3 - 1.2 (mg/dL)   GFR calc non Af Amer 44 (*) >90 (mL/min)   GFR calc Af Amer 52 (*) >90 (mL/min)  CARDIAC PANEL(CRET KIN+CKTOT+MB+TROPI)      Component Value Range   Total CK 57  7 - 232 (U/L)   CK, MB 3.9  0.3 - 4.0 (ng/mL)   Troponin I <0.30  <0.30 (ng/mL)   Relative Index RELATIVE INDEX IS INVALID  0.0 - 2.5   PRO B NATRIURETIC PEPTIDE      Component Value Range   Pro B Natriuretic peptide (BNP) 2846.0 (*) 0 - 450 (pg/mL)   Dg Chest 1 View  06/14/2011  *RADIOLOGY REPORT*  Clinical Data: Weakness.  Anorexia.  Hypertension.  Previous stroke.  CHEST - 1 VIEW  Comparison: 06/10/2011  Findings: New opacity is seen in the lateral left lung base, suspicious for pneumonia.  Right lung remains clear.  Cardiomegaly and chronic pulmonary venous hypertension are stable.  No evidence of pulmonary edema.  Hilar and mediastinal contours remain stable.  IMPRESSION:  1.  New opacity in left lateral lung base, suspicious for pneumonia. 2.  Stable cardiomegaly and chronic pulmonary venous hypertension.  Original Report Authenticated By: Danae Orleans, M.D.   Dg Ribs Unilateral W/chest Right  06/10/2011  *RADIOLOGY REPORT*  Clinical Data: Right rib pain.  Fall last week.  RIGHT RIBS AND CHEST - 3+ VIEW  Comparison: Chest radiograph 08/01/2006  Findings: There is no evidence for a pneumothorax.  Lungs are clear with exception of a nodular density in  the right lower chest.  This could represent a nipple shadow.  Severe degenerative changes in the glenohumeral joints.  There is no evidence for a displaced right rib fracture.  There is electronic device overlying the right upper abdomen.  IMPRESSION: No evidence for a displaced right rib fracture.  Severe degenerative changes in the glenohumeral joints bilaterally.  Nodular density in the right lower chest.  This could represent a nipple shadow and could be confirmed with nipple markers on a follow-up exam.  Original Report Authenticated By: Richarda Overlie, M.D.   Review of lab tests shows CBC and UA negative.  K mildly elevated at 5.3. Cardiac markers negative.  BNP high at 2846, suggests an element of CHF.  Chest x-ray shows pulmonary vascular congestion and a left lower lobe infiltrate.  Will ask [facility] to admit him for CAP, CHF.   5:44 PM Case discussed with Dr. Kerry Hough --> admit to medsurg bed, Rx for CAP, CHF.    No diagnosis found.   I personally performed the services described in this documentation, which was scribed in my presence. The recorded information has been reviewed and considered.  Osvaldo Human, M.D.    William Cooper III, MD 06/14/11 [phone]

## 2011-06-14 NOTE — Progress Notes (Signed)
ANTICOAGULATION CONSULT NOTE - Initial Consult  Pharmacy Consult for Warfarin Indication: stroke History  No Known Allergies  Patient Measurements:     Vital Signs: Temp: 97.9 F (36.6 C) (03/15 2043) Temp src: Oral (03/15 2043) BP: 121/66 mmHg (03/15 2043) Pulse Rate: 58  (03/15 2043)  Labs:  Basename 06/14/11 2149 06/14/11 1628  HGB -- 13.0  HCT -- 37.6*  PLT -- 398  APTT -- --  LABPROT 25.4* --  INR 2.27* --  HEPARINUNFRC -- --  CREATININE -- 1.32  CKTOTAL -- 57  CKMB -- 3.9  TROPONINI -- <0.30   CrCl is unknown because there is no height on file for the current visit.  Medical History: Past Medical History  Diagnosis Date  . Hypertension   . Stroke     Medications:  Prescriptions prior to admission  Medication Sig Dispense Refill  . aliskiren (TEKTURNA) 150 MG tablet Take 150 mg by mouth daily.      Marland Kitchen amLODipine (NORVASC) 5 MG tablet Take 5 mg by mouth daily.      . celecoxib (CELEBREX) 200 MG capsule Take 200 mg by mouth daily.      . furosemide (LASIX) 20 MG tablet Take 20 mg by mouth daily as needed. For swelling and shortness of breath      . HYDROcodone-acetaminophen (NORCO) 5-325 MG per tablet Take 1 tablet by mouth every 6 (six) hours as needed. pain      . psyllium (FIBER THERAPY) 0.52 G capsule Take 0.52 g by mouth daily.      Marland Kitchen warfarin (COUMADIN) 5 MG tablet Take 2.5-5 mg by mouth daily. Patient takes 1 tablet(5mg ) on Monday,Tuesday,Thursday,Saturday,Sunday.. And 1/2 tablet(2.5mg ) on Wednesday,Friday        Assessment: Okay for Protocol  Goal of Therapy:  INR 2-3   Plan:  Warfarin 2.5 mg PO tonight. Daily PT/INR.  Lamonte Richer R 06/14/2011,10:53 PM

## 2011-06-15 ENCOUNTER — Encounter (HOSPITAL_COMMUNITY): Payer: Self-pay | Admitting: Internal Medicine

## 2011-06-15 LAB — CBC
HCT: 36.7 % — ABNORMAL LOW (ref 39.0–52.0)
Hemoglobin: 12.3 g/dL — ABNORMAL LOW (ref 13.0–17.0)
MCH: 31.9 pg (ref 26.0–34.0)
MCHC: 33.5 g/dL (ref 30.0–36.0)
MCV: 95.1 fL (ref 78.0–100.0)
Platelets: 413 10*3/uL — ABNORMAL HIGH (ref 150–400)
RBC: 3.86 MIL/uL — ABNORMAL LOW (ref 4.22–5.81)
RDW: 12.2 % (ref 11.5–15.5)
WBC: 9.2 10*3/uL (ref 4.0–10.5)

## 2011-06-15 LAB — BASIC METABOLIC PANEL
BUN: 28 mg/dL — ABNORMAL HIGH (ref 6–23)
CO2: 28 mEq/L (ref 19–32)
Calcium: 9.1 mg/dL (ref 8.4–10.5)
Chloride: 93 mEq/L — ABNORMAL LOW (ref 96–112)
Creatinine, Ser: 1.34 mg/dL (ref 0.50–1.35)
GFR calc Af Amer: 51 mL/min — ABNORMAL LOW (ref >90)
GFR calc non Af Amer: 44 mL/min — ABNORMAL LOW (ref >90)
Glucose, Bld: 89 mg/dL (ref 70–99)
Potassium: 4.8 mEq/L (ref 3.5–5.1)
Sodium: 128 mEq/L — ABNORMAL LOW (ref 135–145)

## 2011-06-15 MED ORDER — WARFARIN SODIUM 5 MG PO TABS
5.0000 mg | ORAL_TABLET | Freq: Once | ORAL | Status: AC
  Administered 2011-06-15: 5 mg via ORAL
  Filled 2011-06-15: qty 1

## 2011-06-15 MED ORDER — PSYLLIUM 95 % PO PACK
1.0000 | PACK | Freq: Every day | ORAL | Status: DC
  Administered 2011-06-15 – 2011-06-19 (×5): 1 via ORAL
  Filled 2011-06-15 (×7): qty 1

## 2011-06-15 NOTE — Progress Notes (Signed)
Patient transported to [facility] via bed by staff.

## 2011-06-15 NOTE — Progress Notes (Signed)
Subjective: Patient feels about the same, no complaints of shortness of breath or cough  Objective: Vital signs in last 24 hours: Temp:  [97.9 F (36.6 C)-98.7 F (37.1 C)] 97.9 F (36.6 C) (03/16 0641) Pulse Rate:  [49-86] 59  (03/16 0641) Resp:  [16-20] 16  (03/16 0641) BP: (108-163)/(52-66) 163/61 mmHg (03/16 0641) SpO2:  [94 %-98 %] 97 % (03/16 0641) Weight:  [60.328 kg (133 lb)] 60.328 kg (133 lb) (03/15 2043) Weight change:     Intake/Output from previous day: 03/15 0701 - 03/16 0700 In: 1456 [I.V.:1456] Out: 750 [Urine:750] Total I/O In: 480 [P.O.:480] Out: -    Physical Exam: General: Alert, awake, oriented x3, in no acute distress. HEENT: No bruits, no goiter. Heart: Regular rate and rhythm, without murmurs, rubs, gallops. Lungs: Clear to auscultation bilaterally. Abdomen: Soft, nontender, nondistended, positive bowel sounds. Extremities: No clubbing cyanosis or edema with positive pedal pulses. Neuro: Grossly intact, nonfocal.    Lab Results: Basic Metabolic Panel:  Basename 06/15/11 0730 06/14/11 1628  NA 128* 127*  K 4.8 5.3*  CL 93* 93*  CO2 28 27  GLUCOSE 89 96  BUN 28* 33*  CREATININE 1.34 1.32  CALCIUM 9.1 9.4  MG -- --  PHOS -- --   Liver Function Tests:  Basename 06/14/11 1628  AST 18  ALT 11  ALKPHOS 96  BILITOT 0.4  PROT 7.0  ALBUMIN 3.7   No results found for this basename: LIPASE:2,AMYLASE:2 in the last 72 hours No results found for this basename: AMMONIA:2 in the last 72 hours CBC:  Basename 06/15/11 0730 06/14/11 1628  WBC 9.2 10.2  NEUTROABS -- 7.2  HGB 12.3* 13.0  HCT 36.7* 37.6*  MCV 95.1 92.8  PLT 413* 398   Cardiac Enzymes:  Basename 06/14/11 1628  CKTOTAL 57  CKMB 3.9  CKMBINDEX --  TROPONINI <0.30   BNP:  Basename 06/14/11 1628  PROBNP 2846.0*   D-Dimer: No results found for this basename: DDIMER:2 in the last 72 hours CBG: No results found for this basename: GLUCAP:6 in the last 72  hours Hemoglobin A1C: No results found for this basename: HGBA1C in the last 72 hours Fasting Lipid Panel: No results found for this basename: CHOL,HDL,LDLCALC,TRIG,CHOLHDL,LDLDIRECT in the last 72 hours Thyroid Function Tests: No results found for this basename: TSH,T4TOTAL,FREET4,T3FREE,THYROIDAB in the last 72 hours Anemia Panel: No results found for this basename: VITAMINB12,FOLATE,FERRITIN,TIBC,IRON,RETICCTPCT in the last 72 hours Coagulation:  Basename 06/15/11 0730 06/14/11 2149  LABPROT 24.5* 25.4*  INR 2.16* 2.27*   Urine Drug Screen: Drugs of Abuse  No results found for this basename: labopia, cocainscrnur, labbenz, amphetmu, thcu, labbarb    Alcohol Level: No results found for this basename: ETH:2 in the last 72 hours Urinalysis:  Basename 06/14/11 1541  COLORURINE YELLOW  LABSPEC 1.010  PHURINE 6.0  GLUCOSEU NEGATIVE  HGBUR NEGATIVE  BILIRUBINUR NEGATIVE  KETONESUR NEGATIVE  PROTEINUR NEGATIVE  UROBILINOGEN 0.2  NITRITE NEGATIVE  LEUKOCYTESUR NEGATIVE   Recent Results (from the past 240 hour(s))  CULTURE, BLOOD (ROUTINE X 2)     Status: Normal (Preliminary result)   Collection Time   06/14/11  9:45 PM      Component Value Range Status Comment   Specimen Description BLOOD RIGHT ARM   Final    Special Requests BOTTLES DRAWN AEROBIC AND ANAEROBIC 6CC   Final    Culture PENDING   Incomplete    Report Status PENDING   Incomplete   CULTURE, BLOOD (ROUTINE X 2)  Status: Normal (Preliminary result)   Collection Time   06/14/11  9:49 PM      Component Value Range Status Comment   Specimen Description BLOOD RIGHT HAND   Final    Special Requests BOTTLES DRAWN AEROBIC AND ANAEROBIC 5CC   Final    Culture PENDING   Incomplete    Report Status PENDING   Incomplete     Studies/Results: Dg Chest 1 View  06/14/2011  *RADIOLOGY REPORT*  Clinical Data: Weakness.  Anorexia.  Hypertension.  Previous stroke.  CHEST - 1 VIEW  Comparison: 06/10/2011  Findings: New  opacity is seen in the lateral left lung base, suspicious for pneumonia.  Right lung remains clear.  Cardiomegaly and chronic pulmonary venous hypertension are stable.  No evidence of pulmonary edema.  Hilar and mediastinal contours remain stable.  IMPRESSION:  1.  New opacity in left lateral lung base, suspicious for pneumonia. 2.  Stable cardiomegaly and chronic pulmonary venous hypertension.  Original Report Authenticated By: Danae Orleans, M.D.   Ct Head Wo Contrast  06/14/2011  *RADIOLOGY REPORT*  Clinical Data: Fall and confusion  CT HEAD WITHOUT CONTRAST  Technique:  Contiguous axial images were obtained from the base of the skull through the vertex without contrast.  Comparison: Head CT 09/07/2006  Findings: No acute intracranial hemorrhage.  No focal mass lesion. No CT evidence of acute infarction.   No midline shift or mass effect.  No hydrocephalus.  Basilar cisterns are patent.  Encephalomalacia within the left periventricular white matter of the parietal lobe.  This is new from prior but appears chronic. There is generalized cortical atrophy and ventricular dilatation.  Paranasal sinuses and mastoid air cells are clear.  Orbits are normal.  IMPRESSION:  1.  No acute intracranial findings. 2.  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 3.  Atrophy and microvascular disease.  Original Report Authenticated By: Genevive Bi, M.D.    Medications: Scheduled Meds:   . amLODipine  5 mg Oral Daily  . azithromycin  500 mg Intravenous Q24H  . cefTRIAXone (ROCEPHIN) IVPB 1 gram/50 mL D5W  1 g Intravenous Once  . cefTRIAXone (ROCEPHIN)  IV  1 g Intravenous Q24H  . celecoxib  200 mg Oral Daily  . furosemide  40 mg Intravenous Once  . guaiFENesin  1,200 mg Oral BID  . nitroGLYCERIN  1 inch Topical Once  . psyllium  1 packet Oral Daily  . warfarin  2.5 mg Oral Once  . Warfarin - Pharmacist Dosing Inpatient   Does not apply q1800  . DISCONTD: sodium chloride   Intravenous STAT   . DISCONTD: azithromycin  500 mg Intravenous Q24H  . DISCONTD: cefTRIAXone  1 g Intramuscular Once  . DISCONTD: enoxaparin  40 mg Subcutaneous Q24H  . DISCONTD: furosemide  40 mg Intramuscular Once  . DISCONTD: psyllium  0.52 g Oral Daily  . DISCONTD: warfarin  2.5-5 mg Oral Daily   Continuous Infusions:   . sodium chloride 75 mL/hr at 06/15/11 0507  . DISCONTD: sodium chloride 125 mL/hr at 06/14/11 1629   PRN Meds:.HYDROcodone-acetaminophen  Assessment/Plan:  Active Problems:  Community acquired pneumonia  Generalized weakness  Atrial fibrillation  Chronic anticoagulation  History of stroke  Dehydration  Plan:  Patient has remained afebrile and has normal wbc count, continue abx for cap Await PT eval for generalized weakness Dehydration is improving with IVF Hyperkalemia resolved Hyponatremia, improving He is therapeutic on coumadin for history of A fib and prior stroke.  Await PT recommendations, but suspect he will need placement since he is normally home alone.   LOS: 1 day   Jupiter Kabir [facility] Pager: [phone] 06/15/2011, 11:55 AM

## 2011-06-15 NOTE — Progress Notes (Signed)
ANTICOAGULATION CONSULT NOTE  Pharmacy Consult for Warfarin Indication: stroke History  No Known Allergies  Patient Measurements: Height: 5\' 9"  (175.3 cm) Weight: 133 lb (60.328 kg) IBW/kg (Calculated) : 70.7    Vital Signs: Temp: 97.9 F (36.6 C) (03/16 0641) Temp src: Oral (03/16 0641) BP: 163/61 mmHg (03/16 0641) Pulse Rate: 59  (03/16 0641)  Labs:  Basename 06/15/11 0730 06/14/11 2149 06/14/11 1628  HGB 12.3* -- 13.0  HCT 36.7* -- 37.6*  PLT 413* -- 398  APTT -- -- --  LABPROT 24.5* 25.4* --  INR 2.16* 2.27* --  HEPARINUNFRC -- -- --  CREATININE 1.34 -- 1.32  CKTOTAL -- -- 57  CKMB -- -- 3.9  TROPONINI -- -- <0.30   Estimated Creatinine Clearance: 28.8 ml/min (by C-G formula based on Cr of 1.34).  Medical History: Past Medical History  Diagnosis Date  . Hypertension   . Stroke     Medications:  Prescriptions prior to admission  Medication Sig Dispense Refill  . aliskiren (TEKTURNA) 150 MG tablet Take 150 mg by mouth daily.      Marland Kitchen amLODipine (NORVASC) 5 MG tablet Take 5 mg by mouth daily.      . celecoxib (CELEBREX) 200 MG capsule Take 200 mg by mouth daily.      . furosemide (LASIX) 20 MG tablet Take 20 mg by mouth daily as needed. For swelling and shortness of breath      . HYDROcodone-acetaminophen (NORCO) 5-325 MG per tablet Take 1 tablet by mouth every 6 (six) hours as needed. pain      . psyllium (FIBER THERAPY) 0.52 G capsule Take 0.52 g by mouth daily.      Marland Kitchen warfarin (COUMADIN) 5 MG tablet Take 2.5-5 mg by mouth daily. Patient takes 1 tablet(5mg ) on Monday,Tuesday,Thursday,Saturday,Sunday.. And 1/2 tablet(2.5mg ) on Wednesday,Friday        Assessment: Okay for Protocol  Goal of Therapy:  INR 2-3   Plan:  Warfarin 5mg  PO x 1 today.. Daily PT/INR.  Patient on Rocephin and Zithromax which do not require renal adjustment.  Mady Gemma 06/15/2011,12:05 PM

## 2011-06-16 LAB — PROTIME-INR: Prothrombin Time: 26.7 seconds — ABNORMAL HIGH (ref 11.6–15.2)

## 2011-06-16 LAB — URINE CULTURE

## 2011-06-16 LAB — BASIC METABOLIC PANEL
BUN: 22 mg/dL (ref 6–23)
CO2: 25 mEq/L (ref 19–32)
Chloride: 95 mEq/L — ABNORMAL LOW (ref 96–112)
GFR calc Af Amer: 68 mL/min — ABNORMAL LOW (ref >90)
Potassium: 4.8 mEq/L (ref 3.5–5.1)

## 2011-06-16 LAB — LEGIONELLA ANTIGEN, URINE

## 2011-06-16 MED ORDER — WARFARIN SODIUM 2.5 MG PO TABS
2.5000 mg | ORAL_TABLET | Freq: Once | ORAL | Status: AC
  Administered 2011-06-16: 2.5 mg via ORAL
  Filled 2011-06-16: qty 1

## 2011-06-16 MED ORDER — MOXIFLOXACIN HCL 400 MG PO TABS
400.0000 mg | ORAL_TABLET | Freq: Every day | ORAL | Status: DC
  Administered 2011-06-16 – 2011-06-18 (×3): 400 mg via ORAL
  Filled 2011-06-16 (×3): qty 1

## 2011-06-16 NOTE — Progress Notes (Signed)
Subjective: No complaints, feeling well, appetite improved  Objective: Vital signs in last 24 hours: Temp:  [97.9 F (36.6 C)-98.3 F (36.8 C)] 97.9 F (36.6 C) (03/17 0630) Pulse Rate:  [46-51] 51  (03/17 0630) Resp:  [18-20] 20  (03/17 0630) BP: (133-138)/(64-70) 133/70 mmHg (03/17 0630) SpO2:  [98 %] 98 % (03/17 0955) Weight change:  Last BM Date: 06/13/11  Intake/Output from previous day: 03/16 0701 - 03/17 0700 In: 2622.5 [P.O.:960; I.V.:1662.5] Out: 1000 [Urine:1000]     Physical Exam: General: Alert, awake, oriented x3, in no acute distress. HEENT: No bruits, no goiter. Heart: Regular rate and rhythm, without murmurs, rubs, gallops. Lungs: Clear to auscultation bilaterally. Abdomen: Soft, nontender, nondistended, positive bowel sounds. Extremities: No clubbing cyanosis or edema with positive pedal pulses. Neuro: Grossly intact, nonfocal.    Lab Results: Basic Metabolic Panel:  Basename 06/16/11 0403 06/15/11 0730  NA 128* 128*  K 4.8 4.8  CL 95* 93*  CO2 25 28  GLUCOSE 80 89  BUN 22 28*  CREATININE 1.05 1.34  CALCIUM 8.8 9.1  MG -- --  PHOS -- --   Liver Function Tests:  Basename 06/14/11 1628  AST 18  ALT 11  ALKPHOS 96  BILITOT 0.4  PROT 7.0  ALBUMIN 3.7   No results found for this basename: LIPASE:2,AMYLASE:2 in the last 72 hours No results found for this basename: AMMONIA:2 in the last 72 hours CBC:  Basename 06/15/11 0730 06/14/11 1628  WBC 9.2 10.2  NEUTROABS -- 7.2  HGB 12.3* 13.0  HCT 36.7* 37.6*  MCV 95.1 92.8  PLT 413* 398   Cardiac Enzymes:  Basename 06/14/11 1628  CKTOTAL 57  CKMB 3.9  CKMBINDEX --  TROPONINI <0.30   BNP:  Basename 06/14/11 1628  PROBNP 2846.0*   D-Dimer: No results found for this basename: DDIMER:2 in the last 72 hours CBG: No results found for this basename: GLUCAP:6 in the last 72 hours Hemoglobin A1C: No results found for this basename: HGBA1C in the last 72 hours Fasting Lipid Panel: No  results found for this basename: CHOL,HDL,LDLCALC,TRIG,CHOLHDL,LDLDIRECT in the last 72 hours Thyroid Function Tests: No results found for this basename: TSH,T4TOTAL,FREET4,T3FREE,THYROIDAB in the last 72 hours Anemia Panel: No results found for this basename: VITAMINB12,FOLATE,FERRITIN,TIBC,IRON,RETICCTPCT in the last 72 hours Coagulation:  Basename 06/16/11 0403 06/15/11 0730  LABPROT 26.7* 24.5*  INR 2.42* 2.16*   Urine Drug Screen: Drugs of Abuse  No results found for this basename: labopia, cocainscrnur, labbenz, amphetmu, thcu, labbarb    Alcohol Level: No results found for this basename: ETH:2 in the last 72 hours Urinalysis:  Basename 06/14/11 1541  COLORURINE YELLOW  LABSPEC 1.010  PHURINE 6.0  GLUCOSEU NEGATIVE  HGBUR NEGATIVE  BILIRUBINUR NEGATIVE  KETONESUR NEGATIVE  PROTEINUR NEGATIVE  UROBILINOGEN 0.2  NITRITE NEGATIVE  LEUKOCYTESUR NEGATIVE    Recent Results (from the past 240 hour(s))  CULTURE, BLOOD (ROUTINE X 2)     Status: Normal (Preliminary result)   Collection Time   06/14/11  9:45 PM      Component Value Range Status Comment   Specimen Description BLOOD RIGHT ARM   Final    Special Requests BOTTLES DRAWN AEROBIC AND ANAEROBIC 6CC   Final    Culture NO GROWTH 2 DAYS   Final    Report Status PENDING   Incomplete   CULTURE, BLOOD (ROUTINE X 2)     Status: Normal (Preliminary result)   Collection Time   06/14/11  9:49 PM  Component Value Range Status Comment   Specimen Description BLOOD RIGHT HAND   Final    Special Requests BOTTLES DRAWN AEROBIC AND ANAEROBIC 5CC   Final    Culture NO GROWTH 2 DAYS   Final    Report Status PENDING   Incomplete     Studies/Results: Dg Chest 1 View  06/14/2011  *RADIOLOGY REPORT*  Clinical Data: Weakness.  Anorexia.  Hypertension.  Previous stroke.  CHEST - 1 VIEW  Comparison: 06/10/2011  Findings: New opacity is seen in the lateral left lung base, suspicious for pneumonia.  Right lung remains clear.   Cardiomegaly and chronic pulmonary venous hypertension are stable.  No evidence of pulmonary edema.  Hilar and mediastinal contours remain stable.  IMPRESSION:  1.  New opacity in left lateral lung base, suspicious for pneumonia. 2.  Stable cardiomegaly and chronic pulmonary venous hypertension.  Original Report Authenticated By: Danae Orleans, M.D.   Ct Head Wo Contrast  06/14/2011  *RADIOLOGY REPORT*  Clinical Data: Fall and confusion  CT HEAD WITHOUT CONTRAST  Technique:  Contiguous axial images were obtained from the base of the skull through the vertex without contrast.  Comparison: Head CT 09/07/2006  Findings: No acute intracranial hemorrhage.  No focal mass lesion. No CT evidence of acute infarction.   No midline shift or mass effect.  No hydrocephalus.  Basilar cisterns are patent.  Encephalomalacia within the left periventricular white matter of the parietal lobe.  This is new from prior but appears chronic. There is generalized cortical atrophy and ventricular dilatation.  Paranasal sinuses and mastoid air cells are clear.  Orbits are normal.  IMPRESSION:  1.  No acute intracranial findings. 2.  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 3.  Atrophy and microvascular disease.  Original Report Authenticated By: Genevive Bi, M.D.    Medications: Scheduled Meds:   . amLODipine  5 mg Oral Daily  . azithromycin  500 mg Intravenous Q24H  . cefTRIAXone (ROCEPHIN)  IV  1 g Intravenous Q24H  . celecoxib  200 mg Oral Daily  . guaiFENesin  1,200 mg Oral BID  . psyllium  1 packet Oral Daily  . warfarin  2.5 mg Oral ONCE-1800  . warfarin  5 mg Oral ONCE-1800  . Warfarin - Pharmacist Dosing Inpatient   Does not apply q1800   Continuous Infusions:   . sodium chloride 75 mL/hr at 06/16/11 0910   PRN Meds:.HYDROcodone-acetaminophen  Assessment/Plan:  Active Problems:  Community acquired pneumonia  Generalized weakness  Atrial fibrillation  Chronic anticoagulation   History of stroke  Dehydration  Plan:  Pneumonia is improved, change to po avelox Dehydration improved with IVF, discontinue fluids now, so as to avoid volume overload Await PT eval for possible placement   LOS: 2 days   Brandn Mcgath [facility] Pager: [phone] 06/16/2011, 2:55 PM

## 2011-06-16 NOTE — Progress Notes (Signed)
ANTICOAGULATION CONSULT NOTE  Pharmacy Consult for Warfarin Indication: stroke History  No Known Allergies  Patient Measurements: Height: 5\' 9"  (175.3 cm) Weight: 133 lb (60.328 kg) IBW/kg (Calculated) : 70.7    Vital Signs: Temp: 97.9 F (36.6 C) (03/17 0630) Temp src: Oral (03/17 0630) BP: 133/70 mmHg (03/17 0630) Pulse Rate: 51  (03/17 0630)  Labs:  Basename 06/16/11 0403 06/15/11 0730 06/14/11 2149 06/14/11 1628  HGB -- 12.3* -- 13.0  HCT -- 36.7* -- 37.6*  PLT -- 413* -- 398  APTT -- -- -- --  LABPROT 26.7* 24.5* 25.4* --  INR 2.42* 2.16* 2.27* --  HEPARINUNFRC -- -- -- --  CREATININE 1.05 1.34 -- 1.32  CKTOTAL -- -- -- 57  CKMB -- -- -- 3.9  TROPONINI -- -- -- <0.30   Estimated Creatinine Clearance: 36.7 ml/min (by C-G formula based on Cr of 1.05).  Medical History: Past Medical History  Diagnosis Date  . Hypertension   . Stroke     Medications:  Prescriptions prior to admission  Medication Sig Dispense Refill  . aliskiren (TEKTURNA) 150 MG tablet Take 150 mg by mouth daily.      Marland Kitchen amLODipine (NORVASC) 5 MG tablet Take 5 mg by mouth daily.      . celecoxib (CELEBREX) 200 MG capsule Take 200 mg by mouth daily.      . furosemide (LASIX) 20 MG tablet Take 20 mg by mouth daily as needed. For swelling and shortness of breath      . HYDROcodone-acetaminophen (NORCO) 5-325 MG per tablet Take 1 tablet by mouth every 6 (six) hours as needed. pain      . psyllium (FIBER THERAPY) 0.52 G capsule Take 0.52 g by mouth daily.      Marland Kitchen warfarin (COUMADIN) 5 MG tablet Take 2.5-5 mg by mouth daily. Patient takes 1 tablet(5mg ) on Monday,Tuesday,Thursday,Saturday,Sunday.. And 1/2 tablet(2.5mg ) on Wednesday,Friday        Assessment: Okay for Protocol  Goal of Therapy:  INR 2-3   Plan:  Warfarin 2.5mg  PO x 1 today.. Daily PT/INR.  Lamonte Richer R 06/16/2011,11:21 AM

## 2011-06-17 LAB — CBC
Platelets: 404 10*3/uL — ABNORMAL HIGH (ref 150–400)
RBC: 3.99 MIL/uL — ABNORMAL LOW (ref 4.22–5.81)
WBC: 11.2 10*3/uL — ABNORMAL HIGH (ref 4.0–10.5)

## 2011-06-17 LAB — PROTIME-INR: Prothrombin Time: 25.8 seconds — ABNORMAL HIGH (ref 11.6–15.2)

## 2011-06-17 MED ORDER — MOXIFLOXACIN HCL 400 MG PO TABS: 400.0000 mg | ORAL_TABLET | Freq: Every day | ORAL | Status: AC

## 2011-06-17 MED ORDER — SODIUM CHLORIDE 0.9 % IJ SOLN
INTRAMUSCULAR | Status: AC
  Administered 2011-06-17: 3 mL
  Filled 2011-06-17: qty 3

## 2011-06-17 MED ORDER — WARFARIN SODIUM 5 MG PO TABS
5.0000 mg | ORAL_TABLET | Freq: Once | ORAL | Status: AC
  Administered 2011-06-17: 5 mg via ORAL
  Filled 2011-06-17: qty 1

## 2011-06-17 NOTE — Evaluation (Signed)
Physical Therapy Evaluation Patient Details Name: [name] MRN: [id number] DOB: Jan 25, 1916 Today's Date: 06/17/2011  Problem List:  Patient Active Problem List  Diagnoses  . Community acquired pneumonia  . Generalized weakness  . Atrial fibrillation  . Chronic anticoagulation  . History of stroke  . Dehydration    Past Medical History:  Past Medical History  Diagnosis Date  . Hypertension   . Stroke    Past Surgical History: History reviewed. No pertinent past surgical history.  PT Assessment/Plan/Recommendation PT Assessment Clinical Impression Statement: pt is deconditioned due to recent illness and has decreased ambulatory stability and endurance...he would benefit from short term  SNF at d/c in order to decrease fall risk at home PT Recommendation/Assessment: Patient will need skilled PT in the acute care venue PT Problem List: Decreased strength;Decreased activity tolerance;Decreased mobility Barriers to Discharge: Decreased caregiver support Barriers to Discharge Comments: daughter works ouside the home daytime PT Therapy Diagnosis : Difficulty walking;Abnormality of gait;Generalized weakness PT Plan PT Frequency: Min 3X/week PT Treatment/Interventions: Gait training;Therapeutic activities;Therapeutic exercise;Patient/family education PT Recommendation Follow Up Recommendations: Skilled nursing facility Equipment Recommended: Defer to next venue PT Goals  Acute Rehab PT Goals PT Goal Formulation: With patient/family Time For Goal Achievement: 2 weeks Pt will go Supine/Side to Sit: with supervision PT Goal: Supine/Side to Sit - Progress: Goal set today Pt will go Sit to Supine/Side: with supervision PT Goal: Sit to Supine/Side - Progress: Goal set today Pt will go Sit to Stand: with supervision PT Goal: Sit to Stand - Progress: Goal set today Pt will go Stand to Sit: with modified independence PT Goal: Stand to Sit - Progress: Goal set today Pt will  Ambulate: 51 - 150 feet;with rolling walker;with supervision PT Goal: Ambulate - Progress: Goal set today  PT Evaluation Precautions/Restrictions  Precautions Precautions: Fall Required Braces or Orthoses: No Restrictions Weight Bearing Restrictions: No Prior Functioning  Home Living Lives With: Daughter Receives Help From: Family Type of Home: House Home Layout: Multi-level Alternate Level Stairs-Rails: Right Alternate Level Stairs-Number of Steps: 2 steps to bedroom/bath Home Access: Level entry Bathroom Shower/Tub: Engineer, manufacturing systems: Standard Bathroom Accessibility: Yes How Accessible: Accessible via walker Home Adaptive Equipment: Tub transfer bench;Walker - rolling;Straight cane;Raised toilet seat with rails Prior Function Level of Independence: Independent with basic ADLs;Independent with gait;Independent with transfers;Requires assistive device for independence;Needs assistance with homemaking Driving: No Vocation: Retired Producer, television/film/video: Awake/alert Overall Cognitive Status: Appears within functional limits for tasks assessed Orientation Level: Oriented to person;Oriented to place;Oriented to situation Sensation/Coordination Sensation Light Touch: Appears Intact Stereognosis: Not tested Hot/Cold: Not tested Proprioception: Appears Intact Coordination Gross Motor Movements are Fluid and Coordinated: No Fine Motor Movements are Fluid and Coordinated: Not tested Coordination and Movement Description: pt has increased tone R extremeties due to old R hemiparesis Extremity Assessment RLE Assessment RLE Assessment: Within Functional Limits LLE Assessment LLE Assessment: Within Functional Limits Mobility (including Balance) Bed Mobility Bed Mobility: Yes Rolling Right: 6: Modified independent (Device/Increase time) Rolling Left: 6: Modified independent (Device/Increase time) Right Sidelying to Sit: 4: Min assist Right Sidelying  to Sit Details (indicate cue type and reason): tends to fall backward Sitting - Scoot to Edge of Bed: 5: Supervision Sitting - Scoot to [employer] of Bed Details (indicate cue type and reason): tends to fall backward Transfers Transfers: Yes Sit to Stand: 4: Min assist Sit to Stand Details (indicate cue type and reason): very unsteady upon first rising and needed to be stabilized by therapist Stand to  Sit: 5: Supervision Ambulation/Gait Ambulation/Gait: Yes Ambulation/Gait Assistance: 4: Min assist Ambulation/Gait Assistance Details (indicate cue type and reason): gait slightly unsteady due to increased tone RLE and leaning L Ambulation Distance (Feet): 50 Feet Assistive device: Rolling walker Gait Pattern: Decreased step length - left;Decreased stance time - right;Decreased dorsiflexion - right;Decreased weight shift to right;Lateral trunk lean to left (internally rotates R hip) Gait velocity: slow Stairs: No Wheelchair Mobility Wheelchair Mobility: No  Posture/Postural Control Posture/Postural Control: Postural limitations Postural Limitations: thoracic kyphosis Balance Balance Assessed: Yes Static Sitting Balance Static Sitting - Level of Assistance: 5: Stand by assistance Dynamic Sitting Balance Dynamic Sitting - Level of Assistance: 4: Min assist Static Standing Balance Static Standing - Level of Assistance: 3: Mod assist Exercise    End of Session PT - End of Session Equipment Utilized During Treatment: Gait belt Activity Tolerance: Patient tolerated treatment well;Patient limited by fatigue Patient left: in chair;with call bell in reach;with family/visitor present Nurse Communication: Mobility status for transfers;Mobility status for ambulation General Behavior During Session: [facility] for tasks performed Cognition: [facility] for tasks performed  Konrad Penta 06/17/2011, 12:00 PM

## 2011-06-17 NOTE — Progress Notes (Signed)
Referred to CSW today for SNF- spoke with daughter at bedside and will proceed with SNF search in anticipation of offers and d/c for tomorrow. Spoke with MD as well regarding plans-. Reece Levy, MSW, [place] [phone]

## 2011-06-17 NOTE — Plan of Care (Signed)
Problem: Phase II Progression Outcomes Goal: IV changed to normal saline lock Outcome: Completed/Met Date Met:  06/17/11 Completed 3/17 by Day shift RN

## 2011-06-17 NOTE — Progress Notes (Addendum)
Dr. Rito Ehrlich notified of pt being very agitated when lab technician came in, he had not been this way with her previously.  His oxygen was off when the tech entered, she put it back on. VS checked and O2 is 98% on 1.5L.  Pt will not answer any orientation questions at this time, or says he doesn't know. Dr. Rito Ehrlich concerned it is just from lack of rest. Nursing will monitor. Sheryn Bison

## 2011-06-17 NOTE — Progress Notes (Signed)
CARE MANAGEMENT NOTE 06/17/2011  Patient:  MITCH, ARQUETTE   Account Number:  [id number]  Date Initiated:  06/17/2011  Documentation initiated by:  Rosemary Holms  Subjective/Objective Assessment:   Pt admitted from Home with [facility], PT and sitter. Admitted with PNA.     Action/Plan:   PT eval recommends SNF placement. Dr. Kerry Hough notified but bed offers pending   Anticipated DC Date:  06/18/2011   Anticipated DC Plan:  SKILLED NURSING FACILITY      DC Planning Services  CM consult      Choice offered to / List presented to:             Status of service:  In process, will continue to follow Medicare Important Message given?   (If response is "NO", the following Medicare IM given date fields will be blank) Date Medicare IM given:   Date Additional Medicare IM given:    Discharge Disposition:    Per UR Regulation:    If discussed at Long Length of Stay Meetings, dates discussed:    Comments:  [id number] [place] Leanord Hawking RN BSN

## 2011-06-17 NOTE — Progress Notes (Signed)
Dr. Rito Ehrlich notified of pt having some garbled speech. And slight confusion. Pt is otherwise neurologically intact and is alert and oriented to person, place and time. Dr. Rito Ehrlich gave no new orders. Talked with pt and he said that he has trouble getting out is words sometimes, and this has been going on for about 5 yr, since having a stroke. Pt definitely has some difficulty expressing himself at times. Sheryn Bison

## 2011-06-17 NOTE — Discharge Summary (Signed)
Physician Discharge Summary  Patient ID: [name] MRN: [id number] DOB/AGE: 08/26/1915 78 y.o.  Admit date: 06/14/2011 Discharge date: 06/17/2011  Primary Care Physician:  Dwana Melena, MD, MD   Discharge Diagnoses:    Active Problems:  Community acquired pneumonia  Generalized weakness  Atrial fibrillation  Chronic anticoagulation  History of stroke  Dehydration    Medication List  As of 06/17/2011  7:09 PM   STOP taking these medications         aliskiren 150 MG tablet      furosemide 20 MG tablet         TAKE these medications         amLODipine 5 MG tablet   Commonly known as: NORVASC   Take 5 mg by mouth daily.      celecoxib 200 MG capsule   Commonly known as: CELEBREX   Take 200 mg by mouth daily.      FIBER THERAPY 0.52 G capsule   Generic drug: psyllium   Take 0.52 g by mouth daily.      HYDROcodone-acetaminophen 5-325 MG per tablet   Commonly known as: NORCO   Take 1 tablet by mouth every 6 (six) hours as needed. pain      moxifloxacin 400 MG tablet   Commonly known as: AVELOX   Take 1 tablet (400 mg total) by mouth daily at 6 PM.      warfarin 5 MG tablet   Commonly known as: COUMADIN   Take 2.5-5 mg by mouth daily. Patient takes 1 tablet(5mg ) on Monday,Tuesday,Thursday,Saturday,Sunday.. And 1/2 tablet(2.5mg ) on Wednesday,Friday           Discharge exam: Blood pressure 115/64, pulse 46, temperature 97.4 F (36.3 C), temperature source Oral, resp. rate 20, height 5\' 9"  (1.753 m), weight 60.328 kg (133 lb), SpO2 96.00%. NAD CTA B S1, S2 RRR Soft, NT, BS+ No edema b/l  Disposition and Follow-up:  Patient will be discharged to a skilled nursing facility for rehab Repeat basic metabolic panel in one week.   Consults:  none   Significant Diagnostic Studies:  Dg Chest 1 View  06/14/2011  *RADIOLOGY REPORT*  Clinical Data: Weakness.  Anorexia.  Hypertension.  Previous stroke.  CHEST - 1 VIEW  Comparison: 06/10/2011  Findings: New  opacity is seen in the lateral left lung base, suspicious for pneumonia.  Right lung remains clear.  Cardiomegaly and chronic pulmonary venous hypertension are stable.  No evidence of pulmonary edema.  Hilar and mediastinal contours remain stable.  IMPRESSION:  1.  New opacity in left lateral lung base, suspicious for pneumonia. 2.  Stable cardiomegaly and chronic pulmonary venous hypertension.  Original Report Authenticated By: Danae Orleans, M.D.   Ct Head Wo Contrast  06/14/2011  *RADIOLOGY REPORT*  Clinical Data: Fall and confusion  CT HEAD WITHOUT CONTRAST  Technique:  Contiguous axial images were obtained from the base of the skull through the vertex without contrast.  Comparison: Head CT 09/07/2006  Findings: No acute intracranial hemorrhage.  No focal mass lesion. No CT evidence of acute infarction.   No midline shift or mass effect.  No hydrocephalus.  Basilar cisterns are patent.  Encephalomalacia within the left periventricular white matter of the parietal lobe.  This is new from prior but appears chronic. There is generalized cortical atrophy and ventricular dilatation.  Paranasal sinuses and mastoid air cells are clear.  Orbits are normal.  IMPRESSION:  1.  No acute intracranial findings. 2.  Left parietal infarction is new  from prior but appears chronic. 3.  Atrophy and microvascular disease.  Original Report Authenticated By: Genevive Bi, M.D.    Brief H and P: For complete details please refer to admission H and P, but in brief This is a 78 y/o gentleman who has a history of stroke, but up until a few weeks ago, was living independently at home. He suffered a few weeks ago, so his daughter had arranged for home PT to assist in strength training. He had been doing fairly well with this, but then over the last few days he started to become increasingly weak. Patient's po intake has been poor and it has been difficult for him to get out of bed. There has not been a reported fever, cough or  shortness of breath. He generally feels unwell  Work up in the ED revealed dehydration per labs and cxr with a possible pneumonia. He has been referred for admission.     Hospital Course:  This elderly gentleman was admitted to the hospital with generalized weakness and pneumonia chest x-ray. He was started on empiric antibiotics with Rocephin and azithromycin. He remained afebrile and his respiratory status is stable. He was also noted to be significantly dehydrated. He was given IV fluids and his Lasix was held. Currently he appears to be euvolemic. His mental status has overall improved with hydration. We'll transition his antibiotics to oral Avelox. He is continued on Coumadin for atrial fibrillation. The patient does have some dysarthria at baseline due to prior stroke. He was seen by physical therapy and felt that he would benefit from a short stay at a skilled nursing facility for rehabilitation. Currently a bed search is underway and once a bed is available he may be discharged. Patient does have some hyponatremia but is relatively asymptomatic. This can be further followed up with a repeat basic metabolic panel in the next one week. His Lasix may be restarted as felt appropriate according to his volume status.  Time spent on Discharge:  Signed: Hendrix Console [facility] Pager: [phone] 06/17/2011, 7:09 PM

## 2011-06-17 NOTE — Progress Notes (Signed)
Patient to go to snf for rehab upon discharge

## 2011-06-17 NOTE — Progress Notes (Signed)
ANTICOAGULATION CONSULT NOTE  Pharmacy Consult for Warfarin Indication: stroke History  No Known Allergies  Patient Measurements: Height: 5\' 9"  (175.3 cm) Weight: 133 lb (60.328 kg) IBW/kg (Calculated) : 70.7    Vital Signs: Temp: 98 F (36.7 C) (03/18 0520) Temp src: Oral (03/18 0520) BP: 132/69 mmHg (03/18 0520) Pulse Rate: 46  (03/18 0520)  Labs:  Basename 06/17/11 0459 06/16/11 0403 06/15/11 0730 06/14/11 1628  HGB 12.4* -- 12.3* --  HCT 37.4* -- 36.7* 37.6*  PLT 404* -- 413* 398  APTT -- -- -- --  LABPROT 25.8* 26.7* 24.5* --  INR 2.31* 2.42* 2.16* --  HEPARINUNFRC -- -- -- --  CREATININE -- 1.05 1.34 1.32  CKTOTAL -- -- -- 57  CKMB -- -- -- 3.9  TROPONINI -- -- -- <0.30   Estimated Creatinine Clearance: 36.7 ml/min (by C-G formula based on Cr of 1.05).  Medical History: Past Medical History  Diagnosis Date  . Hypertension   . Stroke     Medications:  Prescriptions prior to admission  Medication Sig Dispense Refill  . aliskiren (TEKTURNA) 150 MG tablet Take 150 mg by mouth daily.      Marland Kitchen amLODipine (NORVASC) 5 MG tablet Take 5 mg by mouth daily.      . celecoxib (CELEBREX) 200 MG capsule Take 200 mg by mouth daily.      . furosemide (LASIX) 20 MG tablet Take 20 mg by mouth daily as needed. For swelling and shortness of breath      . HYDROcodone-acetaminophen (NORCO) 5-325 MG per tablet Take 1 tablet by mouth every 6 (six) hours as needed. pain      . psyllium (FIBER THERAPY) 0.52 G capsule Take 0.52 g by mouth daily.      Marland Kitchen warfarin (COUMADIN) 5 MG tablet Take 2.5-5 mg by mouth daily. Patient takes 1 tablet(5mg ) on Monday,Tuesday,Thursday,Saturday,Sunday.. And 1/2 tablet(2.5mg ) on Wednesday,Friday        Assessment: Okay for Protocol  Goal of Therapy:  INR 2-3   Plan:  Warfarin 5 mg PO x 1 today.. Daily PT/INR.  Caryl Asp 06/17/2011,8:43 AM

## 2011-06-18 LAB — PROTIME-INR: Prothrombin Time: 29.4 seconds — ABNORMAL HIGH (ref 11.6–15.2)

## 2011-06-18 MED ORDER — WARFARIN SODIUM 2.5 MG PO TABS
2.5000 mg | ORAL_TABLET | Freq: Once | ORAL | Status: AC
  Administered 2011-06-18: 2.5 mg via ORAL
  Filled 2011-06-18: qty 1

## 2011-06-18 MED ORDER — SODIUM CHLORIDE 0.9 % IJ SOLN
INTRAMUSCULAR | Status: AC
  Administered 2011-06-18: 10 mL
  Filled 2011-06-18: qty 3

## 2011-06-18 NOTE — Progress Notes (Signed)
Subjective: This man is clearly improved. He has no specific complaints. He is awaiting a bed in a skilled nursing facility.           Physical Exam: Blood pressure 144/63, pulse 49, temperature 98 F (36.7 C), temperature source Oral, resp. rate 18, height 5\' 9"  (1.753 m), weight 60.328 kg (133 lb), SpO2 96.00%. He looks systemically well. Heart sounds are present and normal lung fields are entirely clear now, especially no evidence of crackles or bronchial breathing in the left base. He is alert and orientated. He has  residual signs of stroke.   Investigations:  Recent Results (from the past 240 hour(s))  URINE CULTURE     Status: Normal   Collection Time   06/14/11  4:20 PM      Component Value Range Status Comment   Specimen Description URINE, CLEAN CATCH   Final    Special Requests NONE   Final    Culture  Setup Time [id number]   Final    Colony Count NO GROWTH   Final    Culture NO GROWTH   Final    Report Status 06/16/2011 FINAL   Final   CULTURE, BLOOD (ROUTINE X 2)     Status: Normal (Preliminary result)   Collection Time   06/14/11  9:45 PM      Component Value Range Status Comment   Specimen Description BLOOD RIGHT ARM   Final    Special Requests BOTTLES DRAWN AEROBIC AND ANAEROBIC 6CC   Final    Culture NO GROWTH 4 DAYS   Final    Report Status PENDING   Incomplete   CULTURE, BLOOD (ROUTINE X 2)     Status: Normal (Preliminary result)   Collection Time   06/14/11  9:49 PM      Component Value Range Status Comment   Specimen Description BLOOD RIGHT HAND   Final    Special Requests BOTTLES DRAWN AEROBIC AND ANAEROBIC 5CC   Final    Culture NO GROWTH 4 DAYS   Final    Report Status PENDING   Incomplete      Basic Metabolic Panel:  Basename 06/16/11 0403  NA 128*  K 4.8  CL 95*  CO2 25  GLUCOSE 80  BUN 22  CREATININE 1.05  CALCIUM 8.8  MG --  PHOS --       CBC:  Basename 06/17/11 0459  WBC 11.2*  NEUTROABS --  HGB 12.4*  HCT 37.4*  MCV  93.7  PLT 404*        Medications: I have reviewed the patient's current medications.  Impression: 1. Community acquired pneumonia, improving clinically. 2. Cerebrovascular disease. 3. Dehydration, resolved. 4. Deconditioning.     Plan: 1. Continue current therapy. 2. Medically stable to discharge to the skilled nursing facility, when bed available.     LOS: 4 days   Wilson Singer Pager [phone]  06/18/2011, 10:53 AM

## 2011-06-18 NOTE — Progress Notes (Signed)
ANTICOAGULATION CONSULT NOTE  Pharmacy Consult for Warfarin Indication: stroke History  No Known Allergies  Patient Measurements: Height: 5\' 9"  (175.3 cm) Weight: 133 lb (60.328 kg) IBW/kg (Calculated) : 70.7    Vital Signs: Temp: 98 F (36.7 C) (03/19 0356) Temp src: Oral (03/19 0356) BP: 144/63 mmHg (03/19 0356) Pulse Rate: 49  (03/19 0356)  Labs:  Basename 06/18/11 0521 06/17/11 0459 06/16/11 0403  HGB -- 12.4* --  HCT -- 37.4* --  PLT -- 404* --  APTT -- -- --  LABPROT 29.4* 25.8* 26.7*  INR 2.73* 2.31* 2.42*  HEPARINUNFRC -- -- --  CREATININE -- -- 1.05  CKTOTAL -- -- --  CKMB -- -- --  TROPONINI -- -- --   Estimated Creatinine Clearance: 36.7 ml/min (by C-G formula based on Cr of 1.05).  Medical History: Past Medical History  Diagnosis Date  . Hypertension   . Stroke     Medications:  Prescriptions prior to admission  Medication Sig Dispense Refill  . amLODipine (NORVASC) 5 MG tablet Take 5 mg by mouth daily.      . celecoxib (CELEBREX) 200 MG capsule Take 200 mg by mouth daily.      Marland Kitchen HYDROcodone-acetaminophen (NORCO) 5-325 MG per tablet Take 1 tablet by mouth every 6 (six) hours as needed. pain      . psyllium (FIBER THERAPY) 0.52 G capsule Take 0.52 g by mouth daily.      Marland Kitchen warfarin (COUMADIN) 5 MG tablet Take 2.5-5 mg by mouth daily. Patient takes 1 tablet(5mg ) on Monday,Tuesday,Thursday,Saturday,Sunday.. And 1/2 tablet(2.5mg ) on Wednesday,Friday      . DISCONTD: aliskiren (TEKTURNA) 150 MG tablet Take 150 mg by mouth daily.      Marland Kitchen DISCONTD: furosemide (LASIX) 20 MG tablet Take 20 mg by mouth daily as needed. For swelling and shortness of breath        Assessment: Okay for Protocol  Goal of Therapy:  INR 2-3   Plan:  Warfarin 2.5 mg PO x 1 today.. Daily PT/INR.  Gilman Buttner, [place] J 06/18/2011,8:53 AM

## 2011-06-18 NOTE — Progress Notes (Signed)
Physical Therapy Treatment Patient Details Name: BRAELYN BORDONARO MRN: [id number] DOB: [birth year] Today's Date: 06/18/2011 Time: 10:10-10:43 Charge: gait 20 min TE 13 min PT Assessment/Plan  PT - Assessment/Plan Comments on Treatment Session: Pt tolerated well towards total treatment without compliant.  Pt able to ambulate 50 feet with RW and supervision with min vc-ing to increase stride length and posture.   PT Goals  Acute Rehab PT Goals PT Goal: Supine/Side to Sit - Progress: Progressing toward goal PT Goal: Sit to Supine/Side - Progress: Progressing toward goal PT Goal: Sit to Stand - Progress: Progressing toward goal PT Goal: Stand to Sit - Progress: Met PT Goal: Ambulate - Progress: Progressing toward goal  PT Treatment Precautions/Restrictions  Precautions Precautions: Fall Required Braces or Orthoses: No Restrictions Weight Bearing Restrictions: No Mobility (including Balance) Bed Mobility Rolling Left: 5: Supervision Right Sidelying to Sit: 4: Min assist Right Sidelying to Sit Details (indicate cue type and reason): vc-ing for hand placement, pt tends to fall backward Sitting - Scoot to Edge of Bed: 5: Supervision Sitting - Scoot to [place] of Bed Details (indicate cue type and reason): vc-ing for hand placement, pt tends to fall backward Sit to Supine: 5: Supervision Sit to Supine - Details (indicate cue type and reason): cueing for handplacement Transfers Sit to Stand: 4: Min assist Sit to Stand Details (indicate cue type and reason): vcing for hand placement, pt unsteady upon first standing, needs to be stabilized by therapist Stand to Sit: 5: Supervision Ambulation/Gait Ambulation/Gait: Yes Ambulation/Gait Assistance: 5: Supervision;6: Modified independent (Device/Increase time) Ambulation/Gait Assistance Details (indicate cue type and reason): cueing to increase stride length and posture Ambulation Distance (Feet): 50 Feet Assistive device: Rolling walker Gait  Pattern: Decreased stride length;Trunk flexed Gait velocity: slow Stairs: No Wheelchair Mobility Wheelchair Mobility: No    Exercise  Total Joint Exercises Ankle Circles/Pumps: AROM;Both;10 reps;Supine Gluteal Sets: AROM;Both;10 reps;Supine Heel Slides: AROM;Both;10 reps;Supine Hip ABduction/ADduction: AROM;Both;10 reps;Supine Straight Leg Raises: AROM;Both;10 reps;Supine Long Arc Quad: AROM;Both;10 reps Bridges: AROM;Right;10 reps;Supine End of Session PT - End of Session Equipment Utilized During Treatment: Gait belt Activity Tolerance: Patient tolerated treatment well Patient left: in chair;with call bell in reach;with family/visitor present Nurse Communication: Mobility status for transfers;Mobility status for ambulation General Behavior During Session: [facility] for tasks performed Cognition: [facility] for tasks performed  Juel Burrow 06/18/2011, 10:43 AM

## 2011-06-18 NOTE — Progress Notes (Signed)
SNF bed offered and accepted at [facility]- now awaiting [facility] auth for transfer- updated MD and family- Reece Levy, MSW, [place] [phone]

## 2011-06-18 NOTE — Progress Notes (Signed)
Utilization review completed.  

## 2011-06-19 ENCOUNTER — Inpatient Hospital Stay
Admission: RE | Admit: 2011-06-19 | Discharge: 2014-09-30 | Disposition: E | Payer: Medicare HMO | Source: Ambulatory Visit | Attending: Internal Medicine | Admitting: Internal Medicine

## 2011-06-19 DIAGNOSIS — R05 Cough: Secondary | ICD-10-CM

## 2011-06-19 DIAGNOSIS — R0989 Other specified symptoms and signs involving the circulatory and respiratory systems: Secondary | ICD-10-CM

## 2011-06-19 DIAGNOSIS — R1310 Dysphagia, unspecified: Principal | ICD-10-CM

## 2011-06-19 DIAGNOSIS — R509 Fever, unspecified: Secondary | ICD-10-CM

## 2011-06-19 LAB — CULTURE, BLOOD (ROUTINE X 2)

## 2011-06-19 LAB — PROTIME-INR
INR: 2.79 — ABNORMAL HIGH (ref 0.00–1.49)
Prothrombin Time: 29.9 seconds — ABNORMAL HIGH (ref 11.6–15.2)

## 2011-06-19 LAB — CBC
MCHC: 34.4 g/dL (ref 30.0–36.0)
Platelets: 457 10*3/uL — ABNORMAL HIGH (ref 150–400)
RDW: 12.2 % (ref 11.5–15.5)

## 2011-06-19 MED ORDER — WARFARIN SODIUM 2.5 MG PO TABS: 2.5000 mg | ORAL_TABLET | Freq: Every day | ORAL | Status: DC

## 2011-06-19 NOTE — Progress Notes (Signed)
SNF authorization rec'd for transfer today to [facility]. Family aware and in agreement with plans. Reece Levy, MSW, [place] Majors [phone]

## 2011-06-19 NOTE — Progress Notes (Signed)
Subjective: There are no new complaints today. He looks well and feels well.           Physical Exam: Blood pressure 120/53, pulse 53, temperature 97.9 F (36.6 C), temperature source Oral, resp. rate 18, height 5\' 9"  (1.753 m), weight 60.328 kg (133 lb), SpO2 95.00%. He looks systemically well. Heart sounds are present and normal lung fields are entirely clear now, especially no evidence of crackles or bronchial breathing in the left base. He is alert and orientated. He has  residual signs of stroke.   Investigations:  Recent Results (from the past 240 hour(s))  URINE CULTURE     Status: Normal   Collection Time   06/14/11  4:20 PM      Component Value Range Status Comment   Specimen Description URINE, CLEAN CATCH   Final    Special Requests NONE   Final    Culture  Setup Time [id number]   Final    Colony Count NO GROWTH   Final    Culture NO GROWTH   Final    Report Status 06/16/2011 FINAL   Final   CULTURE, BLOOD (ROUTINE X 2)     Status: Normal   Collection Time   06/14/11  9:45 PM      Component Value Range Status Comment   Specimen Description BLOOD RIGHT ARM   Final    Special Requests BOTTLES DRAWN AEROBIC AND ANAEROBIC 6CC   Final    Culture NO GROWTH 5 DAYS   Final    Report Status 07-19-11 FINAL   Final   CULTURE, BLOOD (ROUTINE X 2)     Status: Normal   Collection Time   06/14/11  9:49 PM      Component Value Range Status Comment   Specimen Description BLOOD RIGHT HAND   Final    Special Requests BOTTLES DRAWN AEROBIC AND ANAEROBIC 5CC   Final    Culture NO GROWTH 5 DAYS   Final    Report Status 19-Jul-2011 FINAL   Final      Basic Metabolic Panel: No results found for this basename: NA:2,K:2,CL:2,CO2:2,GLUCOSE:2,BUN:2,CREATININE:2,CALCIUM:2,MG:2,PHOS:2 in the last 72 hours     CBC:  Basename 2011/07/19 0542 06/17/11 0459  WBC 9.0 11.2*  NEUTROABS -- --  HGB 12.7* 12.4*  HCT 36.9* 37.4*  MCV 92.3 93.7  PLT 457* 404*        Medications: I  have reviewed the patient's current medications.  Impression: 1. Community acquired pneumonia, improving clinically. 2. Cerebrovascular disease. 3. Dehydration, resolved. 4. Deconditioning.     Plan: 1. Continue current therapy. 2. Medically stable to discharge to the skilled nursing facility, when bed available. We are awaiting insurance approval.     LOS: 5 days   Wilson Singer Pager [phone]  Jul 19, 2011, 11:46 AM

## 2011-06-19 NOTE — Progress Notes (Signed)
CARE MANAGEMENT NOTE 06/14/2011  Patient:  [name]   Account Number:  [id number]  Date Initiated:  06/18/2011  Documentation initiated by:  Rosemary Holms  Subjective/Objective Assessment:   Pt admitted from home where he lived at home. Admitted with CHF.     Action/Plan:   Pt plans to DC to [facility]   Anticipated DC Date:  06/09/2011   Anticipated DC Plan:  SKILLED NURSING FACILITY      DC Planning Services  CM consult      Choice offered to / List presented to:             Status of service:  Completed, signed off Medicare Important Message given?   (If response is "NO", the following Medicare IM given date fields will be blank) Date Medicare IM given:   Date Additional Medicare IM given:    Discharge Disposition:  SKILLED NURSING FACILITY  Per UR Regulation:    If discussed at Long Length of Stay Meetings, dates discussed:    Comments:  06/18/11 1500 [name] Leanord Hawking RN BSN CM

## 2011-06-19 NOTE — Progress Notes (Signed)
ANTICOAGULATION CONSULT NOTE  Pharmacy Consult for Warfarin Indication: stroke History  No Known Allergies  Patient Measurements: Height: 5\' 9"  (175.3 cm) Weight: 133 lb (60.328 kg) IBW/kg (Calculated) : 70.7    Vital Signs: Temp: 97.9 F (36.6 C) (03/20 0548) Temp src: Oral (03/20 0548) BP: 120/53 mmHg (03/20 0548) Pulse Rate: 53  (03/20 0548)  Labs:  Basename 07-06-2011 0542 06/18/11 0521 06/17/11 0459  HGB 12.7* -- 12.4*  HCT 36.9* -- 37.4*  PLT 457* -- 404*  APTT -- -- --  LABPROT 29.9* 29.4* 25.8*  INR 2.79* 2.73* 2.31*  HEPARINUNFRC -- -- --  CREATININE -- -- --  CKTOTAL -- -- --  CKMB -- -- --  TROPONINI -- -- --   Estimated Creatinine Clearance: 36.7 ml/min (by C-G formula based on Cr of 1.05).  Medical History: Past Medical History  Diagnosis Date  . Hypertension   . Stroke     Medications:  Prescriptions prior to admission  Medication Sig Dispense Refill  . amLODipine (NORVASC) 5 MG tablet Take 5 mg by mouth daily.      . celecoxib (CELEBREX) 200 MG capsule Take 200 mg by mouth daily.      Marland Kitchen HYDROcodone-acetaminophen (NORCO) 5-325 MG per tablet Take 1 tablet by mouth every 6 (six) hours as needed. pain      . psyllium (FIBER THERAPY) 0.52 G capsule Take 0.52 g by mouth daily.      Marland Kitchen warfarin (COUMADIN) 5 MG tablet Take 2.5-5 mg by mouth daily. Patient takes 1 tablet(5mg ) on Monday,Tuesday,Thursday,Saturday,Sunday.. And 1/2 tablet(2.5mg ) on Wednesday,Friday      . DISCONTD: aliskiren (TEKTURNA) 150 MG tablet Take 150 mg by mouth daily.      Marland Kitchen DISCONTD: furosemide (LASIX) 20 MG tablet Take 20 mg by mouth daily as needed. For swelling and shortness of breath        Assessment: Okay for Protocol INR therapeutic  Goal of Therapy:  INR 2-3   Plan:  Warfarin 2.5 mg daily Daily PT/INR until stable  Chandra Feger A July 06, 2011,8:27 AM

## 2011-06-19 NOTE — Progress Notes (Signed)
Physical Therapy Treatment Patient Details Name: KEIANDRE CYGAN MRN: [id number] DOB: 19-Jul-1915 Today's Date: July 11, 2011 No charge PT Assessment/Plan  PT - Assessment/Plan Comments on Treatment Session: Pt refused to complete any exercise or gait today even after explaination of benefits for overall strengthening  and improve functional abilities.  Pt c/o of slight pain all over body, pt encourage to call nurse if pain persists. PT Goals  Acute Rehab PT Goals PT Goal: Supine/Side to Sit - Progress: Not met PT Goal: Sit to Supine/Side - Progress: Not met PT Goal: Sit to Stand - Progress: Not met PT Goal: Stand to Sit - Progress: Not met PT Goal: Ambulate - Progress: Not met  PT Treatment Precautions/Restrictions  Precautions Precautions: Fall Required Braces or Orthoses: No Restrictions Weight Bearing Restrictions: No Mobility (including Balance)      Exercise    End of Session PT - End of Session Patient left: in bed;with call bell in reach;with bed alarm set  Juel Burrow 07/11/11, 11:44 AM

## 2011-06-19 NOTE — Progress Notes (Signed)
Report given to [name] at the [facility]  Pt is alert and stable taken to [facility] by staff members in wheelchair.

## 2011-06-19 NOTE — Progress Notes (Signed)
Patient has SNF bed offer however we are still awaiting SNF Authorization from [facility]. Updated MD and family and will continue to work on this with SNF- Reece Levy, MSW, [place] [phone]

## 2011-07-01 DEATH — deceased

## 2011-07-06 ENCOUNTER — Ambulatory Visit (HOSPITAL_COMMUNITY): Payer: Medicare HMO | Attending: Internal Medicine

## 2012-07-09 ENCOUNTER — Encounter (HOSPITAL_COMMUNITY): Payer: Medicare HMO | Admitting: Speech Pathology

## 2012-07-09 ENCOUNTER — Other Ambulatory Visit (HOSPITAL_COMMUNITY): Payer: Medicare HMO

## 2012-09-03 ENCOUNTER — Non-Acute Institutional Stay (SKILLED_NURSING_FACILITY): Payer: Medicare HMO | Admitting: Internal Medicine

## 2012-09-03 DIAGNOSIS — R001 Bradycardia, unspecified: Secondary | ICD-10-CM

## 2012-09-03 DIAGNOSIS — M1990 Unspecified osteoarthritis, unspecified site: Secondary | ICD-10-CM | POA: Insufficient documentation

## 2012-09-03 DIAGNOSIS — N289 Disorder of kidney and ureter, unspecified: Secondary | ICD-10-CM

## 2012-09-03 DIAGNOSIS — Z8673 Personal history of transient ischemic attack (TIA), and cerebral infarction without residual deficits: Secondary | ICD-10-CM

## 2012-09-03 DIAGNOSIS — I498 Other specified cardiac arrhythmias: Secondary | ICD-10-CM

## 2012-09-03 DIAGNOSIS — F329 Major depressive disorder, single episode, unspecified: Secondary | ICD-10-CM

## 2012-09-03 DIAGNOSIS — I4891 Unspecified atrial fibrillation: Secondary | ICD-10-CM

## 2012-09-03 DIAGNOSIS — I10 Essential (primary) hypertension: Secondary | ICD-10-CM | POA: Insufficient documentation

## 2012-09-03 DIAGNOSIS — Z7901 Long term (current) use of anticoagulants: Secondary | ICD-10-CM

## 2012-09-03 NOTE — Progress Notes (Signed)
Patient ID: [name], male   DOB: [birth year], 79 y.o.   MRN: [id number] This is a routine visit   Vital care skilled.  Facility [facility].  Chief complaint-medical management of atrial fibrillation depression hypertension osteoarthritis history of CVA renal insufficiency allergic rhinitis.  History of present illness.  Patient is a pleasant 79 year old male with the above diagnoses-he's continues to be quite stable despite his general frailty I do not really note any recent issues.  There were originally weight loss issues but this appears to have stabilized.  He also has a history of CVA this has not been an issue during his stay here although he does have general frailty he is on Coumadin with a history of atrial fibrillation.  Her most atrial fibrillation this is rate controlled he is not on any agents again he is on Coumadin-we are monitoring his INR and an update is pending.  Patient does have a history of osteoarthritic pain he had been on Celebrex however he developed increased renal insufficiency with a creatinine rising to 1.37-he got IV fluids Celebrex has been discontinued-his renal function has returned baseline-this pain currently is controlled with Norco.  He also had a significant history of depression he is on Lexapro and has responded very well to this he is more alert talkative and engaged-.  He does have a history of bradycardia this is not new-there is suspicion is this is sick sinus syndrome-he is asymptomatic  Tonight he has no acute complaints his son-in-law is in the room with him--.  Family medical social history has been reviewed her history and physical on 06/24/2011.  Medications have been reviewed per MAR.  Review of systems.  In general denies any fever or chills his weight has been stable.  Head ears eyes nose mouth and throat-does have prescription lenses but apparently does not wear these very often-denies any eye pain or acute changes.  Denies any  sore throat.  Respiratory-no complaints shortness breath or cough.  Cardiac-history of bradycardia and atrial fibrillation but does not complaining of chest pain.  GI-no complaints of abdominal pain nausea vomiting or diarrhea does have a history of constipation that is controlled currently on meds.  Muscle skeletal-at times will complain of right lower arm pain-apparently the Norco is effective.  Neurologic-no complaints of dizziness or headache or syncopal-type feelings.  Physical exam.  He is afebrile-pulses range from the 40s to 50s-respirations 20-blood pressure 123/82 this is baseline-weight is stable at 115.6.  In general this is a frail elderly male in no distress lying comfortably in bed.  The skin is warm and dry do not note any increased bruising or bleeding.  He does have numerous solar induced changes on his face and ears.  Eyes pupils are. nreactive to light sclera and conjunctiva are clear-- when wearing his glasses visual acuity appears intact.  Oropharynx is clear mucous membranes moist.  Chest is clear to auscultation without rhonchi rales or wheezes.  Heart is regular rhythm with a bradycardic rate in the high 40s this is not new-there is no lower extremity edema.  Abdomen soft nontender with active bowel sounds.  Muscle skeletal-does have general frailty but moves all extremities x4-during the day he does ambulate in a wheelchair-I did not appreciate any deformities this evening of the joints.  Neurologic-his speech is somewhat dysarthric but certainly understandable-is able to move all extremities at baseline.  Psych-he is cooperative with exam pleasant and appropriate.  Labs.  08/16/2012.  INR-2.25.  2- 24th 2014.  WBC 7.8 hemoglobin 12.5 platelets 241.  Sodium 135 potassium 4.1 BUN 23 creatinine 1.17  Assessment and plan.  Number -1- history of atrial fibrillation-this is rate controlled on no agents-he is on Coumadin-INR is  pending.  #2-history CVA-is on anticoagulation this has been stable although he is generally weak.  #3-renal insufficiency-improved with the discontinuation of Celebrex this returned baseline Will update metabolic panel to ensure stability.  #4-hypertension this is stable he is on no meds-Norvasc was discontinued and he has tolerated this well.--update BMP  #5-history osteoarthritis-he is on Norco as well as Tylenol this appears to help.  #6-bradycardia-this is at baseline he is asymptomatic continue to monitor not a good candidate for aggressive workup.  #7-depression-this is really improved since his admission here-he is on Lexapro is responding well to this of note he is also on Remeron although this is really for appetite stimulation.  #8-weight loss-this is stabilized he is on supplements as well as Remeron.  Some history of allergic rhinitis that has been asymptomatic for an extended period of time  Of note we will update CBC and CMP for updated values.  [id number] note 30 minutes spent assessing patient-and formulating plan of care for numerous diagnoses  .

## 2012-09-29 ENCOUNTER — Other Ambulatory Visit: Payer: Self-pay | Admitting: Geriatric Medicine

## 2012-09-29 MED ORDER — HYDROCODONE-ACETAMINOPHEN 5-325 MG PO TABS: 1.0000 | ORAL_TABLET | Freq: Four times a day (QID) | ORAL | Status: DC | PRN

## 2012-09-30 ENCOUNTER — Non-Acute Institutional Stay (SKILLED_NURSING_FACILITY): Payer: Medicare HMO | Admitting: Internal Medicine

## 2012-09-30 DIAGNOSIS — I4891 Unspecified atrial fibrillation: Secondary | ICD-10-CM

## 2012-09-30 DIAGNOSIS — Z7901 Long term (current) use of anticoagulants: Secondary | ICD-10-CM

## 2012-09-30 NOTE — Progress Notes (Signed)
Patient ID: [name], male   DOB: [birth year], 79 y.o.   MRN: [id number] Facility; [facility] Center Chief complaint; review of anticoagulation. History; this is a 79 year old man who is a chronic patient of the facility. He has a history of atrial fibrillation and a previous left middle cerebral artery stroke. He may have had more than one stroke. Clearly these are felt to be embolic from atrial fibrillation. He has remained on Coumadin but has a history of for INR refusals and falls. I therefore periodically review him for this. In discussion with the nursing staff. He has not had any recent falls. He has been compliant with his INR checks. He is eating reasonably well except for breakfast a. There has been no weight change.  Physical exam; pulse is 60 and irregularly irregular. Respiratory clear entry bilaterally. Cardiac other than the atrial fibrillation. He has no murmurs. No signs of heart failure. No S3. Mental status; greets me in a warm, friendly fashion, much better than when he first came into the facility. Lexapro. Certainly has helped for his major depression.  Impression/plan; #1 atrial fibrillation/probable sick sinus syndrome. In spite of really misses remained very stable. He is now much more compliant with excepting help for transfers and his INR checks. It therefore seems reasonable to me to continue the Coumadin, which is certainly help prevent any further stroke damage. He has remained stable. With the rationale stated above. I will continue the Coumadin until the equation changes.

## 2012-12-04 ENCOUNTER — Non-Acute Institutional Stay (SKILLED_NURSING_FACILITY): Payer: Medicare HMO | Admitting: Internal Medicine

## 2012-12-04 DIAGNOSIS — M1990 Unspecified osteoarthritis, unspecified site: Secondary | ICD-10-CM

## 2012-12-04 DIAGNOSIS — I4891 Unspecified atrial fibrillation: Secondary | ICD-10-CM

## 2012-12-04 DIAGNOSIS — N289 Disorder of kidney and ureter, unspecified: Secondary | ICD-10-CM

## 2012-12-04 DIAGNOSIS — Z7901 Long term (current) use of anticoagulants: Secondary | ICD-10-CM

## 2012-12-04 DIAGNOSIS — I10 Essential (primary) hypertension: Secondary | ICD-10-CM

## 2012-12-04 DIAGNOSIS — Z8673 Personal history of transient ischemic attack (TIA), and cerebral infarction without residual deficits: Secondary | ICD-10-CM

## 2012-12-04 DIAGNOSIS — F329 Major depressive disorder, single episode, unspecified: Secondary | ICD-10-CM

## 2012-12-04 NOTE — Progress Notes (Signed)
Patient ID: [name], male   DOB: Sep 10, 1915, 79 y.o.   MRN: [id number]   This is a routine visit  Vital care skilled.  Facility [facility] .  Chief complaint-medical management of atrial fibrillation depression hypertension osteoarthritis history of CVA renal insufficiency allergic rhinitis.   History of present illness.  Patient is a pleasant 79 year old male with the above diagnoses-he's continues to be quite stable despite his general frailty I do not really note any recent issues.  There were originally weight loss issues but this appears to have stabilized.  He also has a history of CVA this has not been an issue during his stay here although he does have general frailty he is on Coumadin with a history of atrial fibrillation.  t atrial fibrillation this is rate controlled he is not on any agents again he is on Coumadin-we are monitoring his INR and an update is pending.  Patient does have a history of osteoarthritic pain he had been on Celebrex however he developed increased renal insufficiency with a creatinine rising to 1.37-he got IV fluids Celebrex has been discontinued-his renal function has returned baseline-this pain currently is controlled with Norco.  He also had a significant history of depression he is on Lexapro and has responded very well to this he is more alert talkative and engaged-and this certainly was the case this evening when I saw him.  He does have a history of bradycardia this is not new-there is suspicion is this is sick sinus syndrome-he is asymptomatic  Tonight he has no acute complaints -.  Family medical social history has been reviewed her history and physical on 06/24/2011.   Medications have been reviewed per MAR.   Review of systems.  In general denies any fever or chills his weight has been stable.  Head ears eyes nose mouth and throat-does have prescription lenses but apparently does not wear these very often-denies any eye pain or acute changes.   Denies any sore throat.  Respiratory-no complaints shortness breath or cough.  Cardiac-history of bradycardia and atrial fibrillation but does not complaining of chest pain.  GI-no complaints of abdominal pain nausea vomiting or diarrhea does have a history of constipation that is controlled currently on meds.  Muscle skeletal-at times will complain of right lower arm pain-apparently the Norco is effective.  Neurologic-no complaints of dizziness or headache or syncopal-type feelings.   Physical exam.  Temperature 97.5 pulse 48 respirations 18 blood pressure 122/62-93/68-in this range weight is stable at 115.6 .  In general this is a frail elderly male in no distress lying comfortably in bed.  The skin is warm and dry do not note any increased bruising or bleeding.  He does have numerous solar induced changes on his face and ears.  Eyes pupils are. nreactive to light sclera and conjunctiva are clear- visual acuity appears intact.  Oropharynx is clear mucous membranes moist.  Chest is clear to auscultation without rhonchi rales or wheezes.  Heart is regular rhythm with a bradycardic rate in the high 40s this is not new-there is no lower extremity edema.  Abdomen soft nontender with active bowel sounds--.  Muscle skeletal-does have general frailty but moves all extremities x4-during the day he does ambulate in a wheelchair-I did not appreciate any deformities this evening of the joints.  Neurologic-his speech is somewhat dysarthric but certainly understandable-is able to move all extremities at baseline.  Psych-he is cooperative with exam pleasant and appropriate.   Labs 27 2014.  INR-2.59.  09/07/2012.  WBC 6.4 hemoglobin 12.0 platelets 285.  Sodium 138 potassium 4.5 BUN 30 creatinine 1.18.  Liver function tests within normal limits except albumin of 3.2.  .  08/16/2012.  INR-2.25.  2- 24th 2014.  WBC 7.8 hemoglobin 12.5 platelets 241.  Sodium 135 potassium 4.1 BUN 23  creatinine 1.17   Assessment and plan.  Number -1- history of atrial fibrillation-this is rate controlled on no agents-he is on Coumadin-INR is pending.  #2-history CVA-is on anticoagulation this has been stable although he is generally weak.  #3-renal insufficiency-improved with the discontinuation of Celebrex this returned baseline Will update metabolic panel to ensure stability.  #4-hypertension this is stable he is on no meds-Norvasc was discontinued and he has tolerated this well.--update BMP  #5-history osteoarthritis-he is on Norco as well as Tylenol this appears to help--pain appears to be under decent control.  #6-bradycardia-this is at baseline he is asymptomatic continue to monitor not a good candidate for aggressive workup.  #7-depression-this is really improved since his admission here-he is on Lexapro is responding well to this of note he is also on Remeron although this is really for appetite stimulation.  #8-weight loss-this is stabilized he is on supplements as well as Remeron.  Some history of allergic rhinitis that has been asymptomatic for an extended period of time   [phone]

## 2013-02-03 ENCOUNTER — Other Ambulatory Visit: Payer: Self-pay | Admitting: *Deleted

## 2013-02-03 MED ORDER — HYDROCODONE-ACETAMINOPHEN 5-325 MG PO TABS
1.0000 | ORAL_TABLET | Freq: Four times a day (QID) | ORAL | Status: DC | PRN
Start: 2013-02-03 — End: 2013-04-08

## 2013-03-25 ENCOUNTER — Non-Acute Institutional Stay (SKILLED_NURSING_FACILITY): Payer: Medicare HMO | Admitting: Internal Medicine

## 2013-03-25 DIAGNOSIS — I4891 Unspecified atrial fibrillation: Secondary | ICD-10-CM

## 2013-03-25 DIAGNOSIS — I10 Essential (primary) hypertension: Secondary | ICD-10-CM

## 2013-03-25 DIAGNOSIS — Z8673 Personal history of transient ischemic attack (TIA), and cerebral infarction without residual deficits: Secondary | ICD-10-CM

## 2013-03-25 DIAGNOSIS — N289 Disorder of kidney and ureter, unspecified: Secondary | ICD-10-CM

## 2013-03-25 DIAGNOSIS — Z7901 Long term (current) use of anticoagulants: Secondary | ICD-10-CM

## 2013-03-25 DIAGNOSIS — M1990 Unspecified osteoarthritis, unspecified site: Secondary | ICD-10-CM

## 2013-03-26 NOTE — Progress Notes (Signed)
Patient ID: [name], male   DOB: Jan 17, 1916, 79 y.o.   MRN: [id number] This is a routine visit  Vital care skilled.  Facility [facility]  .  Chief complaint-medical management of atrial fibrillation depression hypertension osteoarthritis history of CVA renal insufficiency allergic rhinitis .  History of present illness.  Patient is a pleasant 79 year old male with the above diagnoses-he's continues to be quite stable despite his general frailty I do not really note any recent issues.  There were originally weight loss issues but this appears to have stabilized.  He also has a history of CVA this has not been an issue during his stay here although he does have general frailty he is on Coumadin with a history of atrial fibrillation.  t atrial fibrillation this is rate controlled he is not on any agents again he is on Coumadin-we are monitoring his INR and an update is pending.  Patient does have a history of osteoarthritic pain he had been on Celebrex however he developed increased renal insufficiency with a creatinine rising to 1.37-he got IV fluids Celebrex has been discontinued-his renal function has returned baseline-this pain currently is controlled with Norco.  He also had a significant history of depression he is on Lexapro and has responded very well to this he is more alert talkative and engaged-and this continues to be  the case this evening when I saw him.  He does have a history of bradycardia this is not new-there is suspicion is this is sick sinus syndrome-he is asymptomatic  Tonight he has no acute complaints --appears to be resting comfortably -.  Family medical social history has been reviewed per history and physical on 06/24/2011.   Medications have been reviewed per MAR.   Review of systems.  In general denies any fever or chills his weight has been stable.  Head ears eyes nose mouth and throat-does have prescription lenses but apparently does not wear these very often-denies  any eye pain or acute changes.  Denies any sore throat.  Respiratory-no complaints shortness breath or cough.  Cardiac-history of bradycardia and atrial fibrillation but does not complaining of chest pain.  GI-no complaints of abdominal pain nausea vomiting or diarrhea does have a history of constipation that is controlled currently on meds.  Muscle skeletal-at times will complain of right lower arm pain-apparently the Norco is effective.--He does not complaining of that this evening  Neurologic-no complaints of dizziness or headache or syncopal-type feelings .  Physical exam.   Temperature 98.3 pulse 50 respirations 18 blood pressure 135/50-2 saturations continued to be in the 90s weight is stable at 1:15 point  .  In general this is a frail elderly male in no distress lying comfortably in bed.  The skin is warm and dry do not note any increased bruising or bleeding.  He does have numerous solar induced changes on his face and ears.  Eyes pupils are. reactive to light sclera and conjunctiva are clear- visual acuity appears intact.  Oropharynx is clear mucous membranes moist.  Chest is clear to auscultation without rhonchi rales or wheezes.  Heart is regular rhythm with a bradycardic rate at 50 this is not new-there is no lower extremity edema.  Abdomen soft nontender with active bowel sounds--.  Muscle skeletal-does have general frailty but moves all extremities x4-during the day he does ambulate in a wheelchair-I did not appreciate any deformities this evening of the joints--there  are  arthritic changes.  Neurologic-his speech is somewhat dysarthric but certainly understandable-is  able to move all extremities at baseline.  Psych-he is cooperative with exam pleasant and appropriate--in fact he was joking with the techs in the room about missing them earlier on Christmas Day .  Labs 03/17/2013.  INR 2.43.  12/07/2012.  WBC 8.0 hemoglobin 12.1 platelets 274.  Sodium 138 potassium 4.4  BUN 29 creatinine 1.17.    27 2014.  INR-2.59.  09/07/2012.  WBC 6.4 hemoglobin 12.0 platelets 285.  Sodium 138 potassium 4.5 BUN 30 creatinine 1.18.  Liver function tests within normal limits except albumin of 3.2.  .  08/16/2012.  INR-2.25.  2- 24th 2014.  WBC 7.8 hemoglobin 12.5 platelets 241.  Sodium 135 potassium 4.1 BUN 23 creatinine 1.17   Assessment and plan.  Number -1- history of atrial fibrillation-this is rate controlled on no agents-he is on Coumadin-INR is pending.  #2-history CVA-is on anticoagulation this has been stable although he is generally weak.--will update  CBC to make sure hemoglobin stable  #3-renal insufficiency-improved with the discontinuation of Celebrex this returned baseline Will update metabolic panel to ensure stability.  #4-hypertension this is stable he is on no meds-Norvasc was discontinued and he has tolerated this well.--update BMP  #5-history osteoarthritis-he is on Norco as well as Tylenol this appears to help--pain appears to be under decent control.  #6-bradycardia-this is at baseline he is asymptomatic continue to monitor not a good candidate for aggressive workup.  #7-depression-this is really improved since his admission here-he is on Lexapro is responding well to this .  #8-weight loss-this is stabilized he is on supplements --Remeron recently DC'd secondary to stability.--Will check albumin  Some history of allergic rhinitis that has been asymptomatic for an extended period of   [phone]

## 2013-04-08 ENCOUNTER — Other Ambulatory Visit: Payer: Self-pay | Admitting: *Deleted

## 2013-04-08 MED ORDER — HYDROCODONE-ACETAMINOPHEN 5-325 MG PO TABS: 1.0000 | ORAL_TABLET | Freq: Four times a day (QID) | ORAL | Status: DC | PRN

## 2013-05-21 ENCOUNTER — Non-Acute Institutional Stay (SKILLED_NURSING_FACILITY): Payer: Commercial Managed Care - HMO | Admitting: Internal Medicine

## 2013-05-21 ENCOUNTER — Encounter: Payer: Self-pay | Admitting: Internal Medicine

## 2013-05-21 ENCOUNTER — Ambulatory Visit (HOSPITAL_COMMUNITY): Payer: Medicare HMO | Attending: Internal Medicine

## 2013-05-21 DIAGNOSIS — Z8673 Personal history of transient ischemic attack (TIA), and cerebral infarction without residual deficits: Secondary | ICD-10-CM

## 2013-05-21 DIAGNOSIS — F32A Depression, unspecified: Secondary | ICD-10-CM

## 2013-05-21 DIAGNOSIS — R509 Fever, unspecified: Secondary | ICD-10-CM | POA: Diagnosis not present

## 2013-05-21 DIAGNOSIS — M1990 Unspecified osteoarthritis, unspecified site: Secondary | ICD-10-CM

## 2013-05-21 DIAGNOSIS — R05 Cough: Secondary | ICD-10-CM | POA: Insufficient documentation

## 2013-05-21 DIAGNOSIS — I10 Essential (primary) hypertension: Secondary | ICD-10-CM

## 2013-05-21 DIAGNOSIS — R059 Cough, unspecified: Secondary | ICD-10-CM | POA: Insufficient documentation

## 2013-05-21 DIAGNOSIS — F329 Major depressive disorder, single episode, unspecified: Secondary | ICD-10-CM

## 2013-05-21 DIAGNOSIS — F3289 Other specified depressive episodes: Secondary | ICD-10-CM

## 2013-05-21 DIAGNOSIS — I4891 Unspecified atrial fibrillation: Secondary | ICD-10-CM

## 2013-05-21 DIAGNOSIS — Z7901 Long term (current) use of anticoagulants: Secondary | ICD-10-CM

## 2013-05-21 NOTE — Progress Notes (Signed)
Patient ID: William ScrapeEdwin H Stafford, male   DOB: [birth year], 80 y.o.   MRN: [id number]             .   Chief complaint-medical management of atrial fibrillation depression hypertension osteoarthritis history of CVA renal insufficiency allergic rhinitis-Acute. visit secondary to cough.   .   History of present illness.   Patient is a pleasant 80 year old male with the above diagnoses-he's continues to be quite stable despite his general frailty -however apparently over the past day or 2 he's developed a cough that is semi-productive.  Chest x-ray was ordered which did not show any acute process.  According to family the cough is somewhat better he has some chest congestion earlier today but again the x-ray did not really show anything significant and his lung sounds are clear this evening Vital signs are stable he does have a minimally low grade temperature at 99.0-family is in the room and states that he appears to be doing t better than he was last night and this morning  He does haveRobitussinordered every 6 hours and this appears to be helping according to family.  ..   There were originally weight loss issues but this appears to have stabilized.   He also has a history of CVA this has not been an issue during his stay here although he does have general frailty he is on Coumadin with a history of atrial fibrillation.   t atrial fibrillation this is rate controlled he is not on any agents again he is on Coumadin-we are monitoring his INR and an update is pending.   Patient does have a history of osteoarthritic pain he had been on Celebrex however he developed increased renal insufficiency with a creatinine rising to 1.37-he got IV fluids Celebrex has been discontinued-his renal function has returned baseline-this pain currently is controlled with Norco.   He also had a significant history of depression he is on Lexapro and has responded very well to this he is more alert talkative and engaged-and  this continues to be  the case this evening when I saw him.   He does have a history of bradycardia this is not new-there is suspicion is this is sick sinus syndrome-he is asymptomatic   Tonight he has no acute complaints--other than the cough-he does not complaining of any shortness of breath-apparently he was somewhat restless last night --appears to be resting comfortably  now -.   Family medical social history has been reviewed per history and physical on 06/24/2011.    Medications have been reviewed per MAR.    Review of systems.   In general denies any fever or chills his weight has been stable.   Head ears eyes nose mouth and throat-does have prescription lenses but apparently does not wear these very often-denies any eye pain or acute changes.   Denies any sore throat.   Respiratory-no complaints shortness breath   Has a  Cough.--Chest congestion earlier today which appears to be improved   Cardiac-history of bradycardia and atrial fibrillation but does not complaining of chest pain.   GI-no complaints of abdominal pain nausea vomiting or diarrhea does have a history of constipation that is controlled currently on meds.   Muscle skeletal-at times will complain of right lower arm pain-apparently the Norco is effective.--He does not complaining of that this evening   Neurologic-no complaints of dizziness or headache or syncopal-type feelings .   Physical exam.    Temperature in 99.0 pulse 53 respirations 16  blood pressure 118/65 weight is stable at 116.8   .   In general this is a frail elderly male in no distress lying comfortably in bed.   The skin is warm and dry do not note any increased bruising or bleeding.   He does have numerous solar induced changes on his face and ears.   Eyes pupils are. reactive to light sclera and conjunctiva are clear- visual acuity appears intact.   Oropharynx is clear mucous membranes moist.   Chest is clear to auscultation without rhonchi rales or  wheezes.   Heart is irregular irregular rate rhythm with a bradycardic rate in 50's this is not new-there is no lower extremity edema.   Abdomen soft nontender with active bowel sounds--.   Muscle skeletal-does have general frailty but moves all extremities x4-during the day he does ambulate in a wheelchair-I did not appreciate any deformities this evening of the joints--there  are  arthritic changes.   Neurologic-his speech is somewhat dysarthric but certainly understandable-is able to move all extremities at baseline.   Psych-he is cooperative with exam pleasant and appropriate-this continues to be stable  .   Labs  05/12/2013.  INR 2.47 03/29/2013.  WBC 6.9 hemoglobin 12.0 platelets 274.  Sodium 135 potassium 4.2 BUN 30 creatinine 1.19.  Liver function tests within normal limits except albumin  of 3.2  03/17/2013.   INR 2.43.   12/07/2012.   WBC 8.0 hemoglobin 12.1 platelets 274.   Sodium 138 potassium 4.4 BUN 29 creatinine 1.17.      27 2014.   INR-2.59.   09/07/2012.   WBC 6.4 hemoglobin 12.0 platelets 285.   Sodium 138 potassium 4.5 BUN 30 creatinine 1.18.   Liver function tests within normal limits except albumin of 3.2.   .   08/16/2012.   INR-2.25.   2- 24th 2014.   WBC 7.8 hemoglobin 12.5 platelets 241.   Sodium 135 potassium 4.1 BUN 23 creatinine 1.17    Assessment and     Number -1- history of atrial fibrillation-this is rate controlled on no agents-he is on Coumadin-INR is pending.   #2-history CVA-is on anticoagulation this has been stable although he is generally weak.--will update  CBC to make sure hemoglobin stable   #3-renal insufficiency-improved with the discontinuation of Celebrex this returned baseline Will update metabolic panel to ensure stability.   #4-hypertension this is stable he is on no meds-Norvasc was discontinued and he has tolerated this well.--update BMP   #5-history osteoarthritis-he is on Norco as well as Tylenol this  appears to help--pain appears to be under decent control.   #6-bradycardia-this is at baseline he is asymptomatic continue to monitor not a good candidate for aggressive workup.   #7-depression-this is really improved since his admission here-he is on Lexapro is responding well to this .   #8-weight loss-this is stabilized he is on supplements --Remeron recently DC'd secondary to stability.--   Cough-apparently this is improving on the Robitussin will continue this-also secondary to low-grade temp will update a CBC with differential.--Also vital signs pulse ox every shift x72 hours  Restlessness-this may be caused by his respiratory issues Will monitor for now he does not appear restless this evening apparently he had a restless night last night however--f    [phone]

## 2013-05-26 ENCOUNTER — Ambulatory Visit (HOSPITAL_COMMUNITY): Payer: Medicare HMO | Attending: Internal Medicine

## 2013-05-26 ENCOUNTER — Non-Acute Institutional Stay (SKILLED_NURSING_FACILITY): Payer: Medicare HMO | Admitting: Internal Medicine

## 2013-05-26 DIAGNOSIS — R059 Cough, unspecified: Secondary | ICD-10-CM | POA: Diagnosis present

## 2013-05-26 DIAGNOSIS — R05 Cough: Secondary | ICD-10-CM | POA: Diagnosis present

## 2013-05-26 DIAGNOSIS — R112 Nausea with vomiting, unspecified: Secondary | ICD-10-CM

## 2013-05-26 DIAGNOSIS — N289 Disorder of kidney and ureter, unspecified: Secondary | ICD-10-CM

## 2013-05-26 DIAGNOSIS — R918 Other nonspecific abnormal finding of lung field: Secondary | ICD-10-CM | POA: Diagnosis not present

## 2013-05-26 DIAGNOSIS — J189 Pneumonia, unspecified organism: Secondary | ICD-10-CM

## 2013-05-26 DIAGNOSIS — D72829 Elevated white blood cell count, unspecified: Secondary | ICD-10-CM

## 2013-05-26 NOTE — Progress Notes (Signed)
Patient ID: [name], male   DOB: January 16, 1916, 80 y.o.   MRN: [id number]   This is an acute visit.  Level of care skilled.  Facility [facility].  Chief complaint-acute visit secondary to nausea and vomiting- with renal insufficiency.  History of present illness.  Patient is a pleasant 80 year old male who has been remarkably stable here for an extended period of time. However he appears to have caught possibly a virus going around the facility with nausea and vomiting as well as diarrhea and apparently has not eaten and drank well here for the past day or so.  Lab work was remarkable for an elevated white count of 14.4 with elevated granulocytes as well as an elevated creatinine and BUN of 1.57 and 72 respectively.  A chest x-ray and urinalysis were ordered-we have received the chest x-ray results which shows infiltrate versus atelectasis of the right lower lobe  Vital signs continued to be stable but he is looking weaker certainly than his baseline.  Family medical social history as been reviewed per history and physical on 06/24/2011.  Medications have been reviewed per MAR.  Review of systems.  In general he does not complaining of fever chills.--Does complain of feeling weaker and not feeling well  Respiratory-does not complaining of shortness of breath--does have a relatively nonproductive cough.  Cardiac no complaints of chest pain or edema.  GI has had nausea and vomiting apparently the vomiting has subsided some also diarrhea [facility] specifically of abdominal pain however.  GU does not complaining of dysuria.  Muscle skeletal complains of feeling weak but does not complaining of joint pain does have a history of joint pain currently controlled on current medications.  Neurologic does not complaining of headache dizziness or syncopal-type feelings.  \ Psych-continues to be pleasant history of depression but this has been stable for some time.  Physical  exam.  Temperature is 98.9 pulse 75 respirations 19 blood pressure 121/64.--O2 saturation is in the 90s on room air  In general this is a pleasant very frail elderly male in no distress sitting comfortably in his chair.  His skin is warm and dry skin turgor does not appear grossly impaired.  Oropharynx is clear mucous membranes appear to be fairly moist he is drinking water  ---a pitcher with cups is at his side.  Chest he does have expiratory coarse breath sounds right lower lung area otherwise clear to auscultation there is no labored breathing.  Heart is regular rate and rhythm without murmur gallop or rub he does not have significant lower extremity edema.  Abdomen is soft nontender with positive bowel sounds.  GU he does not appear to have any suprapubic tenderness.  Muscle skeletal moves all extremities at baseline other than arthritic changes did not note any deformities.  Strength appears to be grossly intact -- he is somewhat diffusely weak.  Neurologic is grossly intact no lateralizing findings.  Psych he is alert and oriented continues to be pleasant but just says he doesn't feel well.  Labs.  05/26/2013.  WBC 14.4 hemoglobin 13.6 platelets 301.  Sodium 137 potassium 4.6 BUN 72 creatinine 1.57.  Assessment and plan.  #1-nausea and vomiting with suspected complications of URI-question aspiration-Will start Avelox 400 mg daily for 7 days-also add Mucinex 600 mg twice a day for 7 days and probiotic twice a day for 7 days.--patient is on Coumadin for history of atrial fibrillation will have to monitor INR closely--if trends up consider switching antibiotics  Also will add  duo nebs every 6 hours when necessary for 1 week as well clinically appears to be quite weak although not unstable.  #2-history of nausea and vomiting-apparently this is subsiding some which is encouraging he does have at times and diarrhea  elevated white count we'll check a C. difficile  culture  #3-renal insufficiency-I suspect this is due to poor by mouth intake-fluids are being encouraged in fact he had water at his side during the exam and he was drinking-feel he will need an IV will start IV normal saline at 70 cc an hour and recheck his labs including a CBC with differential and basic metabolic panel after 2 L.  Also monitor vital signs pulse ox every shift for 72 hours.  Of note I did discuss possibility of hospitalization with his daughter--who is in his room - she has expressed  desire to keep patient in facility and certainly we will honor those wishes-- he will have to be monitored closely.  [id number]-of note greater than 35 minutes spent assessing patient-incoordinated and plan of care for numerous diagnosis-of note greater than 50% of time spent coordinating plan of care            .

## 2013-05-27 ENCOUNTER — Encounter: Payer: Self-pay | Admitting: Internal Medicine

## 2013-05-27 DIAGNOSIS — J189 Pneumonia, unspecified organism: Secondary | ICD-10-CM | POA: Insufficient documentation

## 2013-05-27 DIAGNOSIS — R112 Nausea with vomiting, unspecified: Secondary | ICD-10-CM | POA: Insufficient documentation

## 2013-05-27 DIAGNOSIS — D72829 Elevated white blood cell count, unspecified: Secondary | ICD-10-CM | POA: Insufficient documentation

## 2013-05-27 DIAGNOSIS — J069 Acute upper respiratory infection, unspecified: Secondary | ICD-10-CM | POA: Insufficient documentation

## 2013-05-30 ENCOUNTER — Encounter: Payer: Self-pay | Admitting: Internal Medicine

## 2013-05-30 ENCOUNTER — Non-Acute Institutional Stay (SKILLED_NURSING_FACILITY): Payer: Medicare HMO | Admitting: Internal Medicine

## 2013-05-30 DIAGNOSIS — J069 Acute upper respiratory infection, unspecified: Secondary | ICD-10-CM

## 2013-05-30 DIAGNOSIS — I4891 Unspecified atrial fibrillation: Secondary | ICD-10-CM

## 2013-05-30 DIAGNOSIS — Z7901 Long term (current) use of anticoagulants: Secondary | ICD-10-CM

## 2013-05-30 DIAGNOSIS — J189 Pneumonia, unspecified organism: Secondary | ICD-10-CM

## 2013-05-30 DIAGNOSIS — N289 Disorder of kidney and ureter, unspecified: Secondary | ICD-10-CM

## 2013-05-30 DIAGNOSIS — D72829 Elevated white blood cell count, unspecified: Secondary | ICD-10-CM

## 2013-05-30 DIAGNOSIS — I10 Essential (primary) hypertension: Secondary | ICD-10-CM

## 2013-05-30 NOTE — Progress Notes (Signed)
Patient ID: [name], male   DOB: June 23, 1915, 80 y.o.   MRN: [id number]   This is an acute visit.  Level of care skilled.  [facility]   Chief complaint-acute visit followup URI-renal insufficiency.  History of present illness.  Patient is a pleasant elderly male who experienced some coughing congestion and elevated white count earlier this week along with what appeared to be some worsening renal insufficiency. His white count was 14.4 with elevated granulocyte percentage of 86% on February 25 x-ray showed a suspicion for possible pneumonia versus atelectasis-he appeared to be symptomatic with increased chest congestion and he was started initially on Avelox for one dose and this was switched to Augmentin secondary to patient being on Coumadin and concerns for interaction.  Subsequent lab work has been more encouraging with white count down to 11.2 as of February 27 his creatinine also was improved to 0.89 with a BUN of 42 it had risen to as high as 1.57 on February 25.  He did receive a couple liters of IV fluids in the interim.  According to nursing staff he is doing significantly better today he is more alert eating and drinking more which is encouraging.  He does continue on Augmentin.  Of note his INR was elevated at 4.09 earlier this week as of today this has come down to just over 3-we will restart his Coumadin he is on for a history of A. fib    Family medical social history has been reviewed her history and physical on 06/24/2011.  Medications have been reviewed per MAR.  Review of systems. General denies any fever or chills says he still feels weak.  Skin did not complaining of any increased bruising or bleeding nursing staff has not noted this either.  Respiratory-still has a cough at times does not complaining of shortness of breath.  Cardiac no complaints of chest pain or edema noted.  GI apparently is eating better does not complaining of any nausea or  further vomiting he did have some vomiting initially last week.  GU-does not complain of dysuria urine culture did not grow out any specific organism.  Muscle skeletal diffuse weakness but does not complaining of joint pain currently.  Neurologic no complaints of headache or dizziness.  Psych does have a history of depression although this appears to have improved since he was started on medication-continues to be pleasant and appropriate  .  Physical exam.  He is afebrile pulse is 64 respirations of 18 blood pressure 150/88 skin-however I got 125/62 manually O2 saturation is 97%  Gen. this is a pleasant frail elderly male in no distress sitting comfortably in his chair.  His skin is warm and dry he has some chronic bruising I do not note any new bruising.  Eyes sclera and conjunctiva are clear extraocular movements intact pupils reactive to light visual acuity appears intact.  Oropharynx is clear mucous membranes appear moist.  Chest improved exam from earlier this week --congestion appears to be improved there are a few crackles on the right.  Heart is regular irregular rate and rhythm without murmur gallop or rub there is no significant lower extremity edema.  Muscle skeletal has general frailty moves all extremities and appears at baseline sitting comfortably in his chair.  Neurologic continues with somewhat dysarthric speech this is unchanged I do not see any focal deficits or concerning findings.  Psych continues to be pleasant and appropriate although weak.  .  .  Labs. Reviewed As stated  in history of present illness.  Assessment and plan.  History of URI likely pneumonia-clinically he appears to be improving will be on Augmentin till March 5 continues on Mucinex as well as when necessary duo nebs-clinically he appears to be improved updated labs have been ordered on Tuesday, March 2 including a basic metabolic panel as well as an INR.  #2-history of atrial  fibrillation this appears rate controlled Coumadin is now nearing therapeutic range after being supratherapeutic-no evidence of increased bruising or bleeding-we'll restart his Coumadin at 4 mg a day he had been largely on 5 mg previously I suspect the Avelox may have had something to do with this  #3-history renal insufficiency-again this appears to be trending down most recent creatinine is 0.89 which is encouraging staff he is encouraging fluids probably in fact he has water at his bedside table and appears to be drinking this which is good continue to monitor.  #4-hypertension-??systolic is somewhat elevated  earlier today however generally his blood pressures have been stable--and I got 125/62 manually--- I see previous systolic of 133 this appears to be more his baseline he has been off blood pressure medication Will continue to monitor if this is persistent will address  [id number]--of note 25 minutes spent assessing patient-discussing his status with nursing staff-in coordinating formulating a plan of care-of note greater than 50% of time spent coordinating plan of care

## 2013-06-07 ENCOUNTER — Other Ambulatory Visit: Payer: Self-pay | Admitting: *Deleted

## 2013-06-07 MED ORDER — HYDROCODONE-ACETAMINOPHEN 5-325 MG PO TABS: ORAL_TABLET | ORAL | Status: DC

## 2013-06-07 NOTE — Telephone Encounter (Signed)
[facility] 

## 2013-06-08 ENCOUNTER — Other Ambulatory Visit: Payer: Self-pay | Admitting: *Deleted

## 2013-06-08 MED ORDER — HYDROCODONE-ACETAMINOPHEN 5-325 MG PO TABS: ORAL_TABLET | ORAL | Status: DC

## 2013-06-08 NOTE — Telephone Encounter (Signed)
[facility] 

## 2013-06-15 ENCOUNTER — Encounter: Payer: Self-pay | Admitting: *Deleted

## 2013-07-08 ENCOUNTER — Non-Acute Institutional Stay (SKILLED_NURSING_FACILITY): Payer: Medicare HMO | Admitting: Internal Medicine

## 2013-07-08 ENCOUNTER — Encounter: Payer: Self-pay | Admitting: Internal Medicine

## 2013-07-08 DIAGNOSIS — Z8673 Personal history of transient ischemic attack (TIA), and cerebral infarction without residual deficits: Secondary | ICD-10-CM

## 2013-07-08 DIAGNOSIS — N289 Disorder of kidney and ureter, unspecified: Secondary | ICD-10-CM

## 2013-07-08 DIAGNOSIS — F329 Major depressive disorder, single episode, unspecified: Secondary | ICD-10-CM

## 2013-07-08 DIAGNOSIS — I10 Essential (primary) hypertension: Secondary | ICD-10-CM

## 2013-07-08 DIAGNOSIS — K5900 Constipation, unspecified: Secondary | ICD-10-CM

## 2013-07-08 DIAGNOSIS — Z7901 Long term (current) use of anticoagulants: Secondary | ICD-10-CM

## 2013-07-08 DIAGNOSIS — F32A Depression, unspecified: Secondary | ICD-10-CM

## 2013-07-08 DIAGNOSIS — I4891 Unspecified atrial fibrillation: Secondary | ICD-10-CM

## 2013-07-08 DIAGNOSIS — M1990 Unspecified osteoarthritis, unspecified site: Secondary | ICD-10-CM

## 2013-07-08 DIAGNOSIS — F3289 Other specified depressive episodes: Secondary | ICD-10-CM

## 2013-07-08 NOTE — Progress Notes (Signed)
Patient ID: [name], male   DOB: November 03, 1915, 80 y.o.   MRN: [id number]        This is a routine visit.  Level of care skilled.  [facility]    Chief complaint-medical management of atrial fibrillation depression hypertension osteoarthritis history of CVA renal insufficiency allergic rhinitis-.  .  History of present illness.  Patient is a pleasant 80 year old male with the above diagnoses-he appears to be stable.  Aboutl a month ago he did develop a URI and had renal insufficiency did receive IV fluids and antibiotic and has responded nicely to this  .  He also has a history of CVA this has not been an issue during his stay here although he does have general frailty he is on Coumadin with a history of atrial fibrillation.  t atrial fibrillation this is rate controlled he is not on any rate control agents again he is on Coumadin-we are monitoring his INR and an update is pending.  Patient does have a history of osteoarthritic pain he had been on Celebrex however he developed increased renal insufficiency and this was discontinued -this pain currently is controlled with Norco.  He also had a significant history of depression he is on Lexapro and has responded very well to this he is more alert talkative and engaged-and this continues to be the case this evening when I saw him.  He does have a history of bradycardia this is not new-there is suspicion is this is sick sinus syndrome-he is asymptomatic  Tonight he has no acute complaints-staff has noted a nodule on his left inner  leg groin-however on exam this evening this appears to have largely resolved /   -.  Family medical social history has been reviewed per history and physical on 06/24/2011 .  Medications have been reviewed per MAR.   Review of systems.  In general denies any fever or chills his weight has been stable. Skin- apparently had a nodule in his right upper leg which has largely resolved that she did not complain  of any pain in this area or rash or itching Head ears eyes nose mouth and throat-does have prescription lenses but apparently does not wear these very often-  Denies any sore throat.  Respiratory-no complaints shortness breath  Or cough Cardiac-history of bradycardia and atrial fibrillation but does not complaining of chest pain.  GI-no complaints of abdominal pain nausea vomiting or diarrhea does have a history of constipation he is on Colace however family says he tends to strain at stool.  Muscle skeletal-at times will complain of right lower arm pain-apparently the Norco is effective.--He does not complaining of that this evening  Neurologic-no complaints of dizziness or headache or syncopal-type feelings  .  Physical exam.  Temperature 98.6 pulse 62 respirations 16 blood pressure 149/61-115/67 in this range-weight is 109.6. Marland Kitchen.  In general this is a frail elderly male in no distress.  The skin is warm and dry do not note any increased bruising or bleeding.  He does have numerous solar induced changes on his face and ears Left inner leg I do not really see any nodule or significant erythema.  Eyes pupils are. reactive to light sclera and conjunctiva are clear- visual acuity appears intact.  Oropharynx is clear mucous membranes moist.  Chest is clear to auscultation without rhonchi rales or wheezes.  Heart is irregular irregular rate rhythm with a rate in the 60s' -there is no lower extremity edema.  Abdomen soft nontender with  active bowel sounds--.  Muscle skeletal-does have general frailty but moves all extremities x4-during the day he does ambulate in a wheelchair-I did not appreciate any deformities this evening of the joints--there are arthritic changes.  Neurologic-his speech is somewhat dysarthric but certainly understandable-is able to move all extremities at baseline.  Psych-he is cooperative with exam pleasant and appropriate-this continues to be stable  .   Labs 07/07/2013.  INR 1.91.  07/05/2013.  INR 1.98.  06/28/2013.  INR 1.41.  06/04/2013.  WBC 11.2 hemoglobin 11.9 platelets 387.  06/01/2013.  Sodium 140 potassium 4.8 BUN 29 creatinine 0.99.     05/12/2013.  INR 2.47  03/29/2013.  WBC 6.9 hemoglobin 12.0 platelets 274.  Sodium 135 potassium 4.2 BUN 30 creatinine 1.19.  Liver function tests within normal limits except albumin of 3.2  03/17/2013.  INR 2.43.  12/07/2012.  WBC 8.0 hemoglobin 12.1 platelets 274.  Sodium 138 potassium 4.4 BUN 29 creatinine 1.17.  27 2014.  INR-2.59.  09/07/2012.  WBC 6.4 hemoglobin 12.0 platelets 285.  Sodium 138 potassium 4.5 BUN 30 creatinine 1.18.  Liver function tests within normal limits except albumin of 3.2.  .  08/16/2012.  INR-2.25.  2- 24th 2014.  WBC 7.8 hemoglobin 12.5 platelets 241.  Sodium 135 potassium 4.1 BUN 23 creatinine 1.17   Assessment and  Number -1- history of atrial fibrillation-this is rate controlled on no rate control agents-he is on Coumadin-INR is pending.--Coumadin was just increased to 5.5 mg a day INR is pending for Monday, April 13  #2-history CVA-is on anticoagulation this has been stable although he is generally weak.--will update CBC to make sure hemoglobin stable  #3-renal insufficiency- Will update metabolic panel to ensure stability.  #4-hypertension this is stable he is on no meds-Norvasc was discontinued and he has tolerated this well.--update BMP  #5-history osteoarthritis-he is on Norco as well as Tylenol this appears to help--pain appears to be under decent control.  #6-bradycardia-this is at baseline he is asymptomatic continue to monitor not a good candidate for aggressive workup.  #7-depression-this is really improved since his admission here-he is on Lexapro is responding well to this .  #8-weight loss-appears he's lost about 5  pounds compared to about 5 weeks ago  that might be due to the URI he had about a month ago he weight has  now stabilized about 110 recent weeks at this point continue to monitor he had been on Remeron at 1 point if weight trends down certainly will reconsider  this-he is on supplements   #9  constipation-he is on Colace Will add MiraLax daily hold for diarrhea and monitor.  #10-left upper leg nodule-this appears   [id number]

## 2013-08-24 ENCOUNTER — Other Ambulatory Visit: Payer: Self-pay | Admitting: *Deleted

## 2013-08-24 MED ORDER — HYDROCODONE-ACETAMINOPHEN 5-325 MG PO TABS: ORAL_TABLET | ORAL | Status: DC

## 2013-08-24 NOTE — Telephone Encounter (Signed)
[facility] 

## 2013-10-12 ENCOUNTER — Encounter: Payer: Self-pay | Admitting: Internal Medicine

## 2013-10-12 ENCOUNTER — Non-Acute Institutional Stay (SKILLED_NURSING_FACILITY): Payer: Commercial Managed Care - HMO | Admitting: Internal Medicine

## 2013-10-12 DIAGNOSIS — F329 Major depressive disorder, single episode, unspecified: Secondary | ICD-10-CM

## 2013-10-12 DIAGNOSIS — I10 Essential (primary) hypertension: Secondary | ICD-10-CM

## 2013-10-12 DIAGNOSIS — I482 Chronic atrial fibrillation, unspecified: Secondary | ICD-10-CM

## 2013-10-12 DIAGNOSIS — M159 Polyosteoarthritis, unspecified: Secondary | ICD-10-CM

## 2013-10-12 DIAGNOSIS — Z8673 Personal history of transient ischemic attack (TIA), and cerebral infarction without residual deficits: Secondary | ICD-10-CM

## 2013-10-12 DIAGNOSIS — F32A Depression, unspecified: Secondary | ICD-10-CM

## 2013-10-12 DIAGNOSIS — F3289 Other specified depressive episodes: Secondary | ICD-10-CM

## 2013-10-12 DIAGNOSIS — M150 Primary generalized (osteo)arthritis: Secondary | ICD-10-CM

## 2013-10-12 DIAGNOSIS — I4891 Unspecified atrial fibrillation: Secondary | ICD-10-CM

## 2013-10-12 DIAGNOSIS — Z7901 Long term (current) use of anticoagulants: Secondary | ICD-10-CM

## 2013-10-12 DIAGNOSIS — N289 Disorder of kidney and ureter, unspecified: Secondary | ICD-10-CM

## 2013-10-12 NOTE — Progress Notes (Signed)
Patient ID: [name], male   DOB: July 17, 1915, 80 y.o.   MRN: [id number]   This is a routine visit.  Level of care skilled.  [facility]   Chief complaint-medical management of atrial fibrillation depression hypertension osteoarthritis history of CVA renal insufficiency allergic rhinitis-.  .  History of present illness .  Patient is a pleasant 80 year old male with the above diagnoses-he appears to be stable.  Earlier this year he did develop a URI and had renal insufficiency did receive IV fluids and antibiotic and has responded nicely to this  .  He also has a history of CVA this has not been an issue during his stay here although he does have general frailty he is on Coumadin with a history of atrial fibrillation.  t atrial fibrillation this is rate controlled he is not on any rate control agents again he is on Coumadin-we are monitoring his INR and an update is pending.  Patient does have a history of osteoarthritic pain he had been on Celebrex however he developed increased renal insufficiency and this was discontinued  -this pain currently is controlled with Norco.  He also had a significant history of depression he is on Lexapro and has responded very well to this he is more alert talkative and engaged-and this continues to be the case this evening when I saw him.  He does have a history of bradycardia this is not new-there is suspicion is this is sick sinus syndrome-he is asymptomatic  Tonight he has no acute complaints-nursing staff has not noted any issues either-his weight appears to have stabilized I suspect weight loss earlier this year was due to his URI  /  -.  Family medical social history has been reviewed per history and physical on 06/24/2011  .  Medications have been reviewed per MAR.   Review of systems .  In general denies any fever or chills his weight has been stable.  Skin-  No itching or rashes have been noted  Head ears eyes nose mouth and throat-does  have prescription lenses but apparently does not wear these very often-  Denies any sore throat.  Respiratory-no complaints shortness breath Or cough  Cardiac-history of bradycardia and atrial fibrillation but does not complaining of chest pain.  GI-no complaints of abdominal pain nausea vomiting or diarrhea does have a history of constipation but this appears more stable now on Colace and MiraLax l.  Muscle skeletal-at times will complain of right lower arm pain-apparently the Norco is effective.--He does not complaining of that this evening  Neurologic-no complaints of dizziness or headache or syncopal-type feelings  .  Physical exam.   Temperature 97.8 pulse 54 respirations 18 blood pressure 130/56 weight is stable at 110.6  .  In general this is a frail elderly male in no distress.  The skin is warm and dry do not note any increased bruising or bleeding.  He does have numerous solar induced changes on his face and ears  L.  Eyes pupils are. reactive to light sclera and conjunctiva are clear- visual acuity appears intact.  Oropharynx is clear mucous membranes moist.  Chest is clear to auscultation without rhonchi rales or wheezes.  Heart is irregular irregular rate rhythm with a rate of 50' -there is no lower extremity edema.  Abdomen soft nontender with active bowel sounds--.  Muscle skeletal-does have general frailty but moves all extremities x4-during the day he does ambulate in a wheelchair-I did not appreciate any deformities this evening of  the joints--there are arthritic changes.  Neurologic-his speech is somewhat dysarthric but certainly understandable-is able to move all extremities at baseline.  Psych-he is cooperative with exam pleasant and appropriate-this continues to be stable  .  Labs  10/04/2013.  INR-2.39.  07/12/2013.  WBC 7.3 hemoglobin 11.6 platelets 265.  Sodium 137 potassium 4.5 BUN 30 creatinine 1.13.      07/07/2013.  INR 1.91.  07/05/2013.  INR  1.98.  06/28/2013.  INR 1.41.  06/04/2013.  WBC 11.2 hemoglobin 11.9 platelets 387.  06/01/2013.  Sodium 140 potassium 4.8 BUN 29 creatinine 0.99.  05/12/2013.  INR 2.47  03/29/2013.  WBC 6.9 hemoglobin 12.0 platelets 274.  Sodium 135 potassium 4.2 BUN 30 creatinine 1.19.  Liver function tests within normal limits except albumin of 3.2  03/17/2013.  INR 2.43.  12/07/2012.  WBC 8.0 hemoglobin 12.1 platelets 274.  Sodium 138 potassium 4.4 BUN 29 creatinine 1.17.  27 2014.  INR-2.59.  09/07/2012.  WBC 6.4 hemoglobin 12.0 platelets 285.  Sodium 138 potassium 4.5 BUN 30 creatinine 1.18.  Liver function tests within normal limits except albumin of 3.2.  .  08/16/2012.  INR-2.25.  2- 24th 2014.  WBC 7.8 hemoglobin 12.5 platelets 241.  Sodium 135 potassium 4.1 BUN 23 creatinine 1.17   Assessment and Plan  Number -1- history of atrial fibrillation-this is rate controlled on no rate control agents-he is on Coumadin-INR is pending.--Coumadin dose is 6 mg a day INR is pending    #2-history CVA-is on anticoagulation this has been stable although he is generally weak.--will update CBC to make sure hemoglobin stable   #3-renal insufficiency- Will update metabolic panel to ensure stability.   #4-hypertension this is stable he is on no meds-Norvasc was discontinued and he has tolerated this well.--update metabolic panel   #4-UJWJXBJ osteoarthritis-he is on Norco as well as Tylenol this appears to help--pain appears to be under decent control.   #6-bradycardia-this is at baseline he is asymptomatic continue to monitor not a good candidate for aggressive workup .  #7-depression-this is really improved since his admission here-he is on Lexapro is responding well to this  .  #8-weight loss-this has stabilized I suspect weight loss earlier this year was related to his URI   #9 constipation-he is on Colace as well as MiraLax apparently this is stabilized.  [id number]

## 2013-10-27 ENCOUNTER — Other Ambulatory Visit: Payer: Self-pay | Admitting: *Deleted

## 2013-10-27 MED ORDER — HYDROCODONE-ACETAMINOPHEN 5-325 MG PO TABS: ORAL_TABLET | ORAL | Status: DC

## 2013-10-27 NOTE — Telephone Encounter (Signed)
[facility] 

## 2013-12-21 ENCOUNTER — Non-Acute Institutional Stay (SKILLED_NURSING_FACILITY): Payer: Commercial Managed Care - HMO | Admitting: Internal Medicine

## 2013-12-21 ENCOUNTER — Encounter: Payer: Self-pay | Admitting: Internal Medicine

## 2013-12-21 DIAGNOSIS — J069 Acute upper respiratory infection, unspecified: Secondary | ICD-10-CM

## 2013-12-21 DIAGNOSIS — F3289 Other specified depressive episodes: Secondary | ICD-10-CM

## 2013-12-21 DIAGNOSIS — F329 Major depressive disorder, single episode, unspecified: Secondary | ICD-10-CM

## 2013-12-21 DIAGNOSIS — I482 Chronic atrial fibrillation, unspecified: Secondary | ICD-10-CM

## 2013-12-21 DIAGNOSIS — M159 Polyosteoarthritis, unspecified: Secondary | ICD-10-CM

## 2013-12-21 DIAGNOSIS — I10 Essential (primary) hypertension: Secondary | ICD-10-CM

## 2013-12-21 DIAGNOSIS — N289 Disorder of kidney and ureter, unspecified: Secondary | ICD-10-CM

## 2013-12-21 DIAGNOSIS — I4891 Unspecified atrial fibrillation: Secondary | ICD-10-CM

## 2013-12-21 DIAGNOSIS — M150 Primary generalized (osteo)arthritis: Secondary | ICD-10-CM

## 2013-12-21 DIAGNOSIS — F32A Depression, unspecified: Secondary | ICD-10-CM

## 2013-12-21 NOTE — Progress Notes (Signed)
Patient ID: [name], male   DOB: 12-12-1915, 80 y.o.   MRN: [id number]   This is a routine visit.  Level of care skilled.  Facility [facility]   Chief complaint-medical management of atrial fibrillation depression hypertension osteoarthritis history of CVA renal insufficiency allergic rhinitis-.  .  History of present illness  .  Patient is a pleasant 80 year old male with the above diagnoses-he appears to be stable--however nursing staff and family has noted a runny nose lately he does have a history of allergic rhinitis.  Earlier this year he did develop a URI and had renal insufficiency did receive IV fluids and antibiotic and responded nicely to this He is complaining of just feeling weak today -  .  He also has a history of CVA this has not been an issue during his stay here although he does have general frailty he is on Coumadin with a history of atrial fibrillation--INR is therapeutic update INR is been ordered.  t atrial fibrillation this is rate controlled he is not on any rate control agents again he is on Coumadin-we are monitoring his INR and an update is pending.  Patient does have a history of osteoarthritic pain he had been on Celebrex however he developed increased renal insufficiency and this was discontinued  -this pain currently is controlled with Norco.prn As well as Tylenol which is given routine  He also had a significant history of depression he is on Lexapro and has responded very well to this he is more alert talkative and engaged-although he appears a little bit more down today still pleasant cooperative.  He does have a history of bradycardia this is not new-there is suspicion is this is sick sinus syndrome-he is asymptomatic     /  -.  Family medical social history has been reviewed per history and physical on 06/24/2011  .  Medications have been reviewed per MAR.   Review of systems  .  In general denies any fever or chills his weight has been stable.  Skin-    No itching or rashes have been noted  Head ears eyes nose mouth and throat-does have prescription lenses but apparently does not wear these very often- Nursing staff and family have noted a runny nose  Denies any sore throat.  Respiratory-no complaints shortness breath Or cough does have a history of URI quite significant  Cardiac-history of bradycardia and atrial fibrillation but does not complaining of chest pain.  GI-no complaints of abdominal pain nausea vomiting or diarrhea does have a history of constipation but this appears more stable now on Colace and MiraLax l.  Muscle skeletal-at times will complain of right lower arm pain-apparently the Norco  and Tylenol is effective.--He does not complaining of that this evening  Neurologic-no complaints of dizziness or headache or syncopal-type feelings  .  Physical exam.  Temperature 97.8 pulse 52 respirations 18 blood pressure is variable 145/58-103/66 in this range I did not see consistent lowsr highs which is stable at 108.6  .  In general this is a frail elderly male in no distress.  The skin is warm and dry do not note any increased bruising or bleeding.  He does have numerous solar induced changes on his face and ears  L.  Eyes pupils are. reactive to light sclera and conjunctiva are clear- visual acuity appears intact--appears to have some clear drainage from his nose-possibly a small amount from his eyes as well.  Oropharynx is clear mucous membranes moist.  Chest  generally clear to auscultation but did notice a slight wheeze in the lower right base  Heart is irregular irregular rate rhythm with a rate of 52' -there is no lower extremity edema.  Abdomen soft nontender with active bowel sounds--.  Muscle skeletal-does have general frailty but moves all extremities x4-during the day he does ambulate in a wheelchair-I did not appreciate any deformities this evening of the joints--there are arthritic changes.  Neurologic-his speech is  somewhat dysarthric but certainly understandable-is able to move all extremities at baseline.  Psych-he is cooperative with exam pleasant and appropriate-but just not quite as talkative today   .  Labs 12/20/2013.  INR-2.01.  10/13/2013  WBC 6.7-hemoglobin 12.2-platelets 251.  Sodium 137-potassium 4.4-CO2 level XXIX-BUN 27-creatinine 1.12.  Liver function tests within normal limits except albumin of 3.2.  Marland Kitchen    10/04/2013.  INR-2.39.  07/12/2013.  WBC 7.3 hemoglobin 11.6 platelets 265.  Sodium 137 potassium 4.5 BUN 30 creatinine 1.13.  07/07/2013.  INR 1.91.  07/05/2013.  INR 1.98.  06/28/2013.  INR 1.41.  06/04/2013.  WBC 11.2 hemoglobin 11.9 platelets 387.  06/01/2013.  Sodium 140 potassium 4.8 BUN 29 creatinine 0.99.  05/12/2013.  INR 2.47  03/29/2013.  WBC 6.9 hemoglobin 12.0 platelets 274.  Sodium 135 potassium 4.2 BUN 30 creatinine 1.19.  Liver function tests within normal limits except albumin of 3.2  03/17/2013.  INR 2.43.  12/07/2012.  WBC 8.0 hemoglobin 12.1 platelets 274.  Sodium 138 potassium 4.4 BUN 29 creatinine 1.17.  27 2014.  INR-2.59.  09/07/2012.  WBC 6.4 hemoglobin 12.0 platelets 285.  Sodium 138 potassium 4.5 BUN 30 creatinine 1.18.  Liver function tests within normal limits except albumin of 3.2.  .  08/16/2012.  INR-2.25.  2- 24th 2014.  WBC 7.8 hemoglobin 12.5 platelets 241.  Sodium 135 potassium 4.1 BUN 23 creatinine 1.17    Assessment and Plan   Number -1- history of atrial fibrillation-this is rate controlled on no rate control agents-he is on Coumadin- update INR is pending  #2-history CVA-is on anticoagulation this has been stable although he is generally weak.--will update CBC to make sure hemoglobin stable   #3-renal insufficiency- Will update metabolic panel to ensure stability .  #4-hypertension this is relatively stable he is on no meds-Norvasc was discontinued and he has tolerated this well.--update metabolic panel    #5-history osteoarthritis-he is on Norco as well as Tylenol this appears to help--pain appears to be under decent control.   #6-bradycardia-this is at baseline he is asymptomatic continue to monitor not a good candidate for aggressive workup  .  #7-depression-this is really improved since his admission here-he is on Lexapro is responding well to this  .  #8-weight loss-this has stabilized I suspect weight loss earlier this year was related to his URI  #9 constipation-he is on Colace as well as MiraLax apparently this is stabilized  #2 -- history what appears to be allergic rhinitis-will treat with Claritin 10 mg a day for 5 days-monitor vital signs pulse ox every shift.  #11-some chest congestion noted he does have a significant history of URI appears weaker today-again will update the lab work-also will obtain a chest x-ray-with concerns of a developing URI--also consider starting empiric antibiotic Augmentin 500 mg twice a day for now-along with a probiotic.  Will have to monitor his INR closely and will recheck this in 3 days.  [id number]

## 2013-12-22 ENCOUNTER — Ambulatory Visit (HOSPITAL_COMMUNITY): Payer: Medicare HMO | Attending: Internal Medicine

## 2013-12-22 DIAGNOSIS — R059 Cough, unspecified: Secondary | ICD-10-CM | POA: Insufficient documentation

## 2013-12-22 DIAGNOSIS — R05 Cough: Secondary | ICD-10-CM | POA: Insufficient documentation

## 2013-12-22 DIAGNOSIS — R0989 Other specified symptoms and signs involving the circulatory and respiratory systems: Secondary | ICD-10-CM | POA: Diagnosis not present

## 2014-01-03 ENCOUNTER — Encounter: Payer: Self-pay | Admitting: *Deleted

## 2014-01-05 ENCOUNTER — Other Ambulatory Visit: Payer: Self-pay | Admitting: *Deleted

## 2014-01-05 MED ORDER — HYDROCODONE-ACETAMINOPHEN 5-325 MG PO TABS: ORAL_TABLET | ORAL | Status: DC

## 2014-01-05 NOTE — Telephone Encounter (Signed)
[facility] 

## 2014-02-01 ENCOUNTER — Non-Acute Institutional Stay (SKILLED_NURSING_FACILITY): Payer: Commercial Managed Care - HMO | Admitting: Internal Medicine

## 2014-02-01 ENCOUNTER — Encounter: Payer: Self-pay | Admitting: Internal Medicine

## 2014-02-01 DIAGNOSIS — M150 Primary generalized (osteo)arthritis: Secondary | ICD-10-CM

## 2014-02-01 DIAGNOSIS — I10 Essential (primary) hypertension: Secondary | ICD-10-CM

## 2014-02-01 DIAGNOSIS — I482 Chronic atrial fibrillation, unspecified: Secondary | ICD-10-CM

## 2014-02-01 DIAGNOSIS — N289 Disorder of kidney and ureter, unspecified: Secondary | ICD-10-CM

## 2014-02-01 DIAGNOSIS — M159 Polyosteoarthritis, unspecified: Secondary | ICD-10-CM

## 2014-02-01 DIAGNOSIS — F32A Depression, unspecified: Secondary | ICD-10-CM

## 2014-02-01 DIAGNOSIS — Z7901 Long term (current) use of anticoagulants: Secondary | ICD-10-CM

## 2014-02-01 DIAGNOSIS — F329 Major depressive disorder, single episode, unspecified: Secondary | ICD-10-CM

## 2014-02-01 DIAGNOSIS — Z8673 Personal history of transient ischemic attack (TIA), and cerebral infarction without residual deficits: Secondary | ICD-10-CM

## 2014-02-01 NOTE — Progress Notes (Signed)
Patient ID: [name], male   DOB: 22-May-1915, 80 y.o.   MRN: [id number]   This is a routine visit.  Level of care skilled.  [facility]   Chief complaint-medical management of atrial fibrillation depression hypertension osteoarthritis history of CVA renal insufficiency allergic rhinitis-.  .  History of present illness  .  Patient is a pleasant 80 year old male with the above diagnoses-he appears to be stable--.  Earlier this year he did develop a URI and had renal insufficiency did receive IV fluids and antibiotic and responded nicely to this Earlier today he did complain of a sore throat but is denying that currently-  .  He also has a history of CVA this has not been an issue during his stay here although he does have general frailty he is on Coumadin with a history of atrial fibrillation--INR is therapeutic update INR has been ordered.  t atrial fibrillation this is rate controlled he is not on any rate control agents again he is on Coumadin-we are monitoring his INR and an update is pending.  Patient does have a history of osteoarthritic pain he had been on Celebrex however he developed increased renal insufficiency and this was discontinued  -this pain currently is controlled with Norco.prn As well as Tylenol which is given routine  He also had a significant history of depression he is on Lexapro and has responded very well to this he is more alert talkative and engaged-  He does have a history of bradycardia this is not new-there is suspicion is this is sick sinus syndrome-he is asymptomatic   Today he is sitting up in his chair actually eating dinner-his son who is with him today states he'll probably need all of this which is encouraging-most recent weight is 109--this is stable with his previous weight   /  -.  Family medical social history has been reviewed per history and physical on 06/24/2011  .  Medications have been reviewed per MAR.  Review of  systems  .  In general denies any fever or chills his weight has been stable.  Skin-  No itching or rashes have been noted  Head ears eyes nose mouth and throat-does have prescription lenses but apparently does not wear these very often---had complained of sore throat earlier today Nursing staff and family have noted a runny nose  D.  Respiratory-no complaints shortness breath Or cough does have a history of URI quite significant  Cardiac-history of bradycardia and atrial fibrillation but does not complaining of chest pain.  GI-no complaints of abdominal pain nausea vomiting or diarrhea does have a history of constipation but this appears more stable now on Colace and MiraLax l.  Muscle skeletal-at times will complain of right lower arm pain-apparently the Norco and Tylenol is effective.--He does not complaining of that this evening  Neurologic-no complaints of dizziness or headache or syncopal-type feelings  .  Physical exam.   He is afebrile pulse of 50 respirations 18 blood pressures variable 134/63-02/04/1936 systolically-weight is stable at 109  .  In general this is a frail elderly male in no distress.  The skin is warm and dry do not note any increased bruising or bleeding.  He does have numerous solar induced changes on his face and ears  L.  Eyes pupils are. reactive to light sclera and conjunctiva are clear- visual acuity appears intact-.  Oropharynx is clear mucous membranes moist--I do not note any exudate or increased erythema.  Chest is clear to  auscultation there is no labored breathing  Heart is irregular irregular rate rhythm with a rate of 50' -there is no lower extremity edema.  Abdomen soft nontender with active bowel sounds--.  Muscle skeletal-does have general frailty but moves all extremities x4-during the day he does ambulate in a wheelchair-I did not appreciate any deformities this evening of the joints--there are arthritic changes.   Neurologic-his speech is somewhat dysarthric but certainly understandable-is able to move all extremities at baseline.  Psych-he is cooperative with exam pleasant and appropriate-  .  Labs  01/31/2014.  INR 2.43.  12/22/2013.  WBC 6.9 hemoglobin 13.0 platelets 268.  Sodium 136 potassium 4.5 BUN 24 creatinine 1.08.   12/20/2013.  INR-2.01.  10/13/2013  WBC 6.7-hemoglobin 12.2-platelets 251.  Sodium 137-potassium 4.4-CO2 level XXIX-BUN 27-creatinine 1.12.  Liver function tests within normal limits except albumin of 3.2.  Marland Kitchen.   10/04/2013.  INR-2.39.  07/12/2013.  WBC 7.3 hemoglobin 11.6 platelets 265.  Sodium 137 potassium 4.5 BUN 30 creatinine 1.13.  07/07/2013.  INR 1.91.  07/05/2013.  INR 1.98.  06/28/2013.  INR 1.41.  06/04/2013.  WBC 11.2 hemoglobin 11.9 platelets 387.  06/01/2013.  Sodium 140 potassium 4.8 BUN 29 creatinine 0.99.  05/12/2013.  INR 2.47  03/29/2013.  WBC 6.9 hemoglobin 12.0 platelets 274.  Sodium 135 potassium 4.2 BUN 30 creatinine 1.19.  Liver function tests within normal limits except albumin of 3.2  03/17/2013.  INR 2.43.  12/07/2012.  WBC 8.0 hemoglobin 12.1 platelets 274.  Sodium 138 potassium 4.4 BUN 29 creatinine 1.17.  27 2014.  INR-2.59.  09/07/2012.  WBC 6.4 hemoglobin 12.0 platelets 285.  Sodium 138 potassium 4.5 BUN 30 creatinine 1.18.  Liver function tests within normal limits except albumin of 3.2.  .  08/16/2012.  INR-2.25.  2- 24th 2014.  WBC 7.8 hemoglobin 12.5 platelets 241.  Sodium 135 potassium 4.1 BUN 23 creatinine 1.17    Assessment and Plan  Number -1- history of atrial fibrillation-this is rate controlled on no rate control agents-he is on Coumadin- update INR is pending  #2-history CVA-is on anticoagulation this has been stable --  #3-renal insufficiency- this appears stable per most recent lab .  #4-hypertension this is relatively stable he is  on no meds-Norvasc was discontinued and he has tolerated this well.--at this point continue to monitor I do note some systolics in the 140s--we will order blood pressure checks daily with a log for review but there is consistency consider restarting medication  #5-history osteoarthritis-he is on Norco as well as Tylenol this appears to help--pain appears to be under decent control.  #6-bradycardia-this is at baseline he is asymptomatic continue to monitor not a good candidate for aggressive workup  .  #7-depression-this is really improved since his admission here-he is on Lexapro is responding well to this  .  #8-weight loss-this has stabilized I suspect weight loss earlier this year was related to his URI   #9 constipation-he is on Colace as well as MiraLax apparently this is stabilized  #10 -- history what appears to be allergic rhinitis-at this point appear stable at times will need an antihistamine  #11-sore throat-t-physical exam was quite benign Will order Chloraseptic spray when necessary and monitor -he does not complain of a sore throat this evening .  [id number]

## 2014-02-02 ENCOUNTER — Encounter: Payer: Self-pay | Admitting: Internal Medicine

## 2014-02-02 ENCOUNTER — Non-Acute Institutional Stay (SKILLED_NURSING_FACILITY): Payer: Commercial Managed Care - HMO | Admitting: Internal Medicine

## 2014-02-02 DIAGNOSIS — J029 Acute pharyngitis, unspecified: Secondary | ICD-10-CM

## 2014-02-02 DIAGNOSIS — R0989 Other specified symptoms and signs involving the circulatory and respiratory systems: Secondary | ICD-10-CM

## 2014-02-02 NOTE — Progress Notes (Signed)
Patient ID: [name], male   DOB: 1916-01-21, 80 y.o.   MRN: [id number]   This is an acute visit.  Level care skilled.  [facility] Chief complaintacute visit secondary to question chest congestion-.  History [facility].  A shunt is a pleasant elderly resident was so recently for a routine visit-he had complained of a sore throat and has been prescribed Chloraseptic Spray-.  I spoke with his daughter tonight who states that he appears to be somewhat congested this evening-.  Patient does state his breathing status not quite what it was a couple days ago.  He does not complain of acute shortness of breath or any chest pain.  He does have a history of pneumonia in the past  Family medical social history as been reviewed her previous progress notes including 07/08/2013.  Medications have been reviewed per MAR.  Review of systems.  General does not complain of fever chills.  Respiratory does have somewhat of a dry cough says his breathing is not quite what it was a couple days ago although does not complain of acute shortness of breath certainly.  Head ears eyes nose mouth and throat-is complaining of a sore throat.  Cardiac does not complain of chest pain or palpitations no edema.  GI does not complain of any nausea or vomiting or abdominal pain.  Physical exam.  Temperature is pending-pulses 45 respirations 18 blood pressure most recently 145/70.  In general this is a frail elderly male in no distress sitting comfortably in his chair.  His skin is warm and dry.  Oropharynx is clear mucous membranes moist slightlyslightlyerythematousthereisnoexudate.  Ches--tshallowairentr---yaminimalwheezeleftlungbase.  Heart  isbradycardic without murmur galloporrub.  Abdomens  Soft nontenderp ositivebowelsounds.  Labs.      Assessment and plan.  #1-mild chest congestion and sore  throat-will continue the throat spray when necessary-will order a chest x-ray as well  with his history  Of URI---monitor vital signs pulse ox every shift 72 hours.  Also start Mucinex 600 mg twice a day for 5 days   CPT-[id number].

## 2014-02-03 ENCOUNTER — Ambulatory Visit (HOSPITAL_COMMUNITY): Payer: Medicare HMO | Attending: Internal Medicine

## 2014-02-03 DIAGNOSIS — R0989 Other specified symptoms and signs involving the circulatory and respiratory systems: Secondary | ICD-10-CM | POA: Diagnosis not present

## 2014-02-03 DIAGNOSIS — R05 Cough: Secondary | ICD-10-CM | POA: Diagnosis not present

## 2014-04-04 ENCOUNTER — Other Ambulatory Visit: Payer: Self-pay | Admitting: *Deleted

## 2014-04-04 MED ORDER — HYDROCODONE-ACETAMINOPHEN 5-325 MG PO TABS: ORAL_TABLET | ORAL | Status: DC

## 2014-04-04 NOTE — Telephone Encounter (Signed)
[facility] 

## 2014-04-26 ENCOUNTER — Non-Acute Institutional Stay (SKILLED_NURSING_FACILITY): Payer: Commercial Managed Care - HMO | Admitting: Internal Medicine

## 2014-04-26 ENCOUNTER — Encounter: Payer: Self-pay | Admitting: Internal Medicine

## 2014-04-26 DIAGNOSIS — Z7901 Long term (current) use of anticoagulants: Secondary | ICD-10-CM

## 2014-04-26 DIAGNOSIS — F32A Depression, unspecified: Secondary | ICD-10-CM

## 2014-04-26 DIAGNOSIS — I10 Essential (primary) hypertension: Secondary | ICD-10-CM | POA: Diagnosis not present

## 2014-04-26 DIAGNOSIS — M150 Primary generalized (osteo)arthritis: Secondary | ICD-10-CM

## 2014-04-26 DIAGNOSIS — I482 Chronic atrial fibrillation, unspecified: Secondary | ICD-10-CM

## 2014-04-26 DIAGNOSIS — Z8673 Personal history of transient ischemic attack (TIA), and cerebral infarction without residual deficits: Secondary | ICD-10-CM

## 2014-04-26 DIAGNOSIS — M159 Polyosteoarthritis, unspecified: Secondary | ICD-10-CM

## 2014-04-26 DIAGNOSIS — F329 Major depressive disorder, single episode, unspecified: Secondary | ICD-10-CM

## 2014-04-26 DIAGNOSIS — N289 Disorder of kidney and ureter, unspecified: Secondary | ICD-10-CM

## 2014-04-26 NOTE — Progress Notes (Signed)
Patient ID: [name], male   DOB: [birth year], 81 y.o.   MRN: [id number]   this is a routine visit.  Level of care skilled.  Facility [facility]   Chief complaint-medical management of atrial fibrillation depression hypertension osteoarthritis history of CVA renal insufficiency allergic rhinitis-.  .  History of present illness  .  Patient is a pleasant 81 year old male with the above diagnoses-he appears to be stable--.  Earlier this year he did develop a URI and had renal insufficiency did receive IV fluids and antibiotic and responded nicely to this He is completing a course of amoxicillin for Proteus UTI-he is no longer complaining of dysuria  .  He also has a history of CVA this has not been an issue during his stay here although he does have general frailty he is on Coumadin with a history of atrial fibrillation--INR is therapeutic update INR has been ordered.--Several days ago it was supratherapeutic at 3.67 but Coumadin has been held this has since been restarted  t atrial fibrillation this is rate controlled he is not on any rate control agents again he is on Coumadin-we are monitoring his INR and an update is pending.  Patient does have a history of osteoarthritic pain he had been on Celebrex however he developed increased renal insufficiency and this was discontinued  -his pain currently is controlled with Norco.prn As well as Tylenol which is given routine  He also had a significant history of depression he is on Lexapro and has responded very well to this he is more alert talkative and engaged-  He does have a history of bradycardia this is not new-there is suspicion is this is sick sinus syndrome-he is asymptomatic   His weight this month is 105.2 this is down about 4 pounds since last autumn this will need to be monitored he is on supplements including Ensure   /  -.  Family medical social history has been reviewed per history and physical on 06/24/2011  .   Medications have been reviewed per MAR.  Review of systems  .  In general denies any fever or chills   Skin-  No itching or rashes have been noted  Head ears eyes nose mouth and throat-does have prescription lenses but apparently does not wear these very often--- does not complaining of sore throat today D.  Respiratory-no complaints shortness breath Or cough does have a history of URI quite significant  Cardiac-history of bradycardia and atrial fibrillation but does not complaining of chest pain.  GI-no complaints of abdominal pain nausea vomiting or diarrhea does have a history of constipation but this appears more stable now on Colace and MiraLax l.  Muscle skeletal-at times will complain of right lower arm pain-apparently the Norco and Tylenol is effective.--He does not complaining of that today  Neurologic-no complaints of dizziness or headache or syncopal-type feelings  .  Physical exam.   Temperature is 97.5 pulse 54 respirations 18 blood pressure taken manually 138/60 8I see variable systolics ranging from 170s-120s weight is 105.2  .  In general this is a frail elderly male in no distress.  The skin is warm and dry do not note any increased bruising or bleeding.  He does have numerous solar induced changes on his face and ears  L.  Eyes pupils are. reactive to light sclera and conjunctiva are clear- visual acuity appears intact-.  Oropharynx is clear mucous membranes moist--I do not note any exudate or increased erythema.  Chest is clear to  auscultation there is no labored breathing  Heart is irregular irregular rate rhythm with a rate of 54' -there is no lower extremity edema.  Abdomen soft nontender with active bowel sounds--.  Muscle skeletal-does have general frailty but moves all extremities x4-during the day he does ambulate in a wheelchair-I did not appreciate any deformities t--there are arthritic changes. and general  frailty Neurologic-his speech is somewhat dysarthric but certainly understandable-is able to move all extremities at baseline.  Psych-he is cooperative with exam pleasant and appropriate-  .  Labs  12/22/2013.  WBC 6.9 hemoglobin 13.0 which 268.  Sodium 136 potassium 4.5 BUN 24 creatinine 1.08  01/31/2014.  INR 2.43.  12/22/2013.  WBC 6.9 hemoglobin 13.0 platelets 268.  Sodium 136 potassium 4.5 BUN 24 creatinine 1.08.   12/20/2013.  INR-2.01.  10/13/2013  WBC 6.7-hemoglobin 12.2-platelets 251.  Sodium 137-potassium 4.4-CO2 level XXIX-BUN 27-creatinine 1.12.  Liver function tests within normal limits except albumin of 3.2.  Marland Kitchen.   10/04/2013.  INR-2.39.  07/12/2013.  WBC 7.3 hemoglobin 11.6 platelets 265.  Sodium 137 potassium 4.5 BUN 30 creatinine 1.13.  07/07/2013.  INR 1.91.  07/05/2013.  INR 1.98.  06/28/2013.  INR 1.41.  06/04/2013.  WBC 11.2 hemoglobin 11.9 platelets 387.  06/01/2013.  Sodium 140 potassium 4.8 BUN 29 creatinine 0.99.  05/12/2013.  INR 2.47  03/29/2013.  WBC 6.9 hemoglobin 12.0 platelets 274.  Sodium 135 potassium 4.2 BUN 30 creatinine 1.19.  Liver function tests within normal limits except albumin of 3.2  03/17/2013.  INR 2.43.  12/07/2012.  WBC 8.0 hemoglobin 12.1 platelets 274.  Sodium 138 potassium 4.4 BUN 29 creatinine 1.17.  27 2014.  INR-2.59.  09/07/2012.  WBC 6.4 hemoglobin 12.0 platelets 285.  Sodium 138 potassium 4.5 BUN 30 creatinine 1.18.  Liver function tests within normal limits except albumin of 3.2.  .  08/16/2012.  INR-2.25.  2- 24th 2014.  WBC 7.8 hemoglobin 12.5 platelets 241.  Sodium 135 potassium 4.1 BUN 23 creatinine 1.17    Assessment and Plan  Number -1- history of atrial fibrillation-this is rate controlled on no rate control agents-he is on Coumadin- update INR is pending  #2-history CVA-is on anticoagulation INR has normalized update has been  ordered for tomorrow --  #3-renal insufficiency- this appears stable per most recent lab will update .  #4-hypertension this is relatively stable he is on no meds-Norvasc was discontinued and he has tolerated this well.--at this point continue to monitor I do note some labile systolics-blood pressure today was satisfactory at 138/68 at this point continue to monitor    #5-history osteoarthritis-he is on Norco as well as Tylenol this appears to help--pain appears to be under decent control.  #6-bradycardia-this is at baseline he is asymptomatic continue to monitor not a good candidate for aggressive workup  .  #7-depression-this is really improved since his admission here-he is on Lexapro is responding well to this  .  #8-weight loss-this appears to be somewhat moderate he is on supplementation this will have to be monitored-we will change weights take Q weekly for now--- also will update metabolic panel   #9 constipation-he is on Colace as well as MiraLax apparently this is stabilized  #10 -- history what appears to be allergic rhinitis-at this point appear stable at times will need an antihistamine  Of note in addition to metabolic panel well update CBC for updated values   [id number]    .

## 2014-05-02 ENCOUNTER — Other Ambulatory Visit: Payer: Self-pay | Admitting: *Deleted

## 2014-05-02 MED ORDER — HYDROCODONE-ACETAMINOPHEN 5-325 MG PO TABS: ORAL_TABLET | ORAL | Status: DC

## 2014-05-02 NOTE — Telephone Encounter (Signed)
[facility] 

## 2014-05-04 ENCOUNTER — Other Ambulatory Visit: Payer: Self-pay | Admitting: *Deleted

## 2014-05-04 MED ORDER — HYDROCODONE-ACETAMINOPHEN 5-325 MG PO TABS: ORAL_TABLET | ORAL | Status: DC

## 2014-05-04 NOTE — Telephone Encounter (Signed)
[facility] 

## 2014-05-08 ENCOUNTER — Encounter (HOSPITAL_COMMUNITY)
Admission: RE | Admit: 2014-05-08 | Discharge: 2014-05-08 | Disposition: A | Discharge status: Home / Self Care | Payer: Commercial Managed Care - HMO | Source: Skilled Nursing Facility | Attending: Internal Medicine | Admitting: Internal Medicine

## 2014-05-08 DIAGNOSIS — Z7901 Long term (current) use of anticoagulants: Secondary | ICD-10-CM | POA: Insufficient documentation

## 2014-05-09 ENCOUNTER — Encounter (HOSPITAL_COMMUNITY)
Admission: RE | Admit: 2014-05-09 | Discharge: 2014-05-09 | Disposition: A | Discharge status: Home / Self Care | Payer: Commercial Managed Care - HMO | Source: Skilled Nursing Facility | Attending: Internal Medicine | Admitting: Internal Medicine

## 2014-05-09 DIAGNOSIS — Z7901 Long term (current) use of anticoagulants: Secondary | ICD-10-CM | POA: Diagnosis not present

## 2014-05-09 LAB — PROTIME-INR
INR: 2.5 — AB (ref 0.00–1.49)
PROTHROMBIN TIME: 27.2 s — AB (ref 11.6–15.2)

## 2014-05-16 ENCOUNTER — Encounter (HOSPITAL_COMMUNITY)
Admission: RE | Admit: 2014-05-16 | Discharge: 2014-05-16 | Disposition: A | Discharge status: Home / Self Care | Payer: Commercial Managed Care - HMO | Source: Skilled Nursing Facility | Attending: Internal Medicine | Admitting: Internal Medicine

## 2014-05-16 DIAGNOSIS — Z7901 Long term (current) use of anticoagulants: Secondary | ICD-10-CM | POA: Diagnosis not present

## 2014-05-16 LAB — PROTIME-INR
INR: 1.48 (ref 0.00–1.49)
Prothrombin Time: 18.1 seconds — ABNORMAL HIGH (ref 11.6–15.2)

## 2014-05-23 ENCOUNTER — Encounter (HOSPITAL_COMMUNITY)
Admission: RE | Admit: 2014-05-23 | Discharge: 2014-05-23 | Disposition: A | Discharge status: Home / Self Care | Payer: Commercial Managed Care - HMO | Source: Skilled Nursing Facility | Attending: Internal Medicine | Admitting: Internal Medicine

## 2014-05-23 DIAGNOSIS — Z7901 Long term (current) use of anticoagulants: Secondary | ICD-10-CM | POA: Diagnosis not present

## 2014-05-23 LAB — PROTIME-INR
INR: 2.71 — ABNORMAL HIGH (ref 0.00–1.49)
PROTHROMBIN TIME: 29 s — AB (ref 11.6–15.2)

## 2014-05-24 ENCOUNTER — Encounter (HOSPITAL_COMMUNITY)
Admission: RE | Admit: 2014-05-24 | Discharge: 2014-05-24 | Disposition: A | Discharge status: Home / Self Care | Payer: Commercial Managed Care - HMO | Source: Skilled Nursing Facility | Attending: Internal Medicine | Admitting: Internal Medicine

## 2014-05-24 DIAGNOSIS — Z7901 Long term (current) use of anticoagulants: Secondary | ICD-10-CM | POA: Diagnosis not present

## 2014-05-24 LAB — PROTIME-INR
INR: 3.09 — ABNORMAL HIGH (ref 0.00–1.49)
Prothrombin Time: 32.1 seconds — ABNORMAL HIGH (ref 11.6–15.2)

## 2014-05-30 ENCOUNTER — Encounter (HOSPITAL_COMMUNITY)
Admission: RE | Admit: 2014-05-30 | Discharge: 2014-05-30 | Disposition: A | Discharge status: Home / Self Care | Payer: Commercial Managed Care - HMO | Source: Skilled Nursing Facility | Attending: Internal Medicine | Admitting: Internal Medicine

## 2014-05-30 DIAGNOSIS — Z7901 Long term (current) use of anticoagulants: Secondary | ICD-10-CM | POA: Diagnosis not present

## 2014-05-30 LAB — PROTIME-INR
INR: 2.52 — ABNORMAL HIGH (ref 0.00–1.49)
PROTHROMBIN TIME: 27.4 s — AB (ref 11.6–15.2)

## 2014-06-06 ENCOUNTER — Encounter (HOSPITAL_COMMUNITY)
Admission: RE | Admit: 2014-06-06 | Discharge: 2014-06-06 | Disposition: A | Discharge status: Home / Self Care | Payer: Commercial Managed Care - HMO | Source: Skilled Nursing Facility | Attending: Internal Medicine | Admitting: Internal Medicine

## 2014-06-06 DIAGNOSIS — Z7901 Long term (current) use of anticoagulants: Secondary | ICD-10-CM | POA: Diagnosis present

## 2014-06-06 LAB — PROTIME-INR
INR: 1.7 — ABNORMAL HIGH (ref 0.00–1.49)
PROTHROMBIN TIME: 20.1 s — AB (ref 11.6–15.2)

## 2014-06-13 ENCOUNTER — Encounter (HOSPITAL_COMMUNITY)
Admission: RE | Admit: 2014-06-13 | Discharge: 2014-06-13 | Disposition: A | Discharge status: Home / Self Care | Payer: Commercial Managed Care - HMO | Source: Skilled Nursing Facility | Attending: Internal Medicine | Admitting: Internal Medicine

## 2014-06-13 DIAGNOSIS — Z7901 Long term (current) use of anticoagulants: Secondary | ICD-10-CM | POA: Diagnosis not present

## 2014-06-13 LAB — PROTIME-INR
INR: 2.42 — ABNORMAL HIGH (ref 0.00–1.49)
PROTHROMBIN TIME: 26.5 s — AB (ref 11.6–15.2)

## 2014-06-20 ENCOUNTER — Encounter (HOSPITAL_COMMUNITY)
Admission: RE | Admit: 2014-06-20 | Discharge: 2014-06-20 | Disposition: A | Discharge status: Home / Self Care | Payer: Commercial Managed Care - HMO | Source: Skilled Nursing Facility | Attending: Internal Medicine | Admitting: Internal Medicine

## 2014-06-20 DIAGNOSIS — Z7901 Long term (current) use of anticoagulants: Secondary | ICD-10-CM | POA: Diagnosis not present

## 2014-06-20 LAB — PROTIME-INR
INR: 3.16 — AB (ref 0.00–1.49)
Prothrombin Time: 32.6 seconds — ABNORMAL HIGH (ref 11.6–15.2)

## 2014-06-24 ENCOUNTER — Other Ambulatory Visit (HOSPITAL_COMMUNITY)
Admission: AD | Admit: 2014-06-24 | Discharge: 2014-06-24 | Disposition: A | Discharge status: Home / Self Care | Payer: Commercial Managed Care - HMO | Source: Skilled Nursing Facility | Attending: Internal Medicine | Admitting: Internal Medicine

## 2014-06-24 DIAGNOSIS — Z7901 Long term (current) use of anticoagulants: Secondary | ICD-10-CM | POA: Diagnosis present

## 2014-06-24 LAB — PROTIME-INR
INR: 2.38 — ABNORMAL HIGH (ref 0.00–1.49)
PROTHROMBIN TIME: 26.2 s — AB (ref 11.6–15.2)

## 2014-06-27 ENCOUNTER — Encounter (HOSPITAL_COMMUNITY)
Admission: RE | Admit: 2014-06-27 | Discharge: 2014-06-27 | Disposition: A | Discharge status: Home / Self Care | Payer: Commercial Managed Care - HMO | Source: Skilled Nursing Facility | Attending: Internal Medicine | Admitting: Internal Medicine

## 2014-06-27 DIAGNOSIS — Z7901 Long term (current) use of anticoagulants: Secondary | ICD-10-CM | POA: Diagnosis not present

## 2014-06-27 LAB — PROTIME-INR
INR: 2.54 — ABNORMAL HIGH (ref 0.00–1.49)
PROTHROMBIN TIME: 27.6 s — AB (ref 11.6–15.2)

## 2014-06-30 ENCOUNTER — Other Ambulatory Visit (HOSPITAL_COMMUNITY)
Admission: AD | Admit: 2014-06-30 | Discharge: 2014-06-30 | Disposition: A | Discharge status: Home / Self Care | Payer: Medicare HMO | Source: Skilled Nursing Facility | Attending: Internal Medicine | Admitting: Internal Medicine

## 2014-06-30 LAB — PROTIME-INR
INR: 2.48 — ABNORMAL HIGH (ref 0.00–1.49)
PROTHROMBIN TIME: 27.1 s — AB (ref 11.6–15.2)

## 2014-07-04 ENCOUNTER — Encounter (HOSPITAL_COMMUNITY)
Admission: RE | Admit: 2014-07-04 | Discharge: 2014-07-04 | Disposition: A | Discharge status: Home / Self Care | Payer: Commercial Managed Care - HMO | Source: Skilled Nursing Facility | Attending: Internal Medicine | Admitting: Internal Medicine

## 2014-07-04 DIAGNOSIS — Z7901 Long term (current) use of anticoagulants: Secondary | ICD-10-CM | POA: Insufficient documentation

## 2014-07-04 LAB — PROTIME-INR
INR: 3.05 — ABNORMAL HIGH (ref 0.00–1.49)
PROTHROMBIN TIME: 31.8 s — AB (ref 11.6–15.2)

## 2014-07-11 ENCOUNTER — Encounter (HOSPITAL_COMMUNITY)
Admission: RE | Admit: 2014-07-11 | Discharge: 2014-07-11 | Disposition: A | Discharge status: Home / Self Care | Payer: Commercial Managed Care - HMO | Source: Skilled Nursing Facility | Attending: Internal Medicine | Admitting: Internal Medicine

## 2014-07-11 DIAGNOSIS — Z7901 Long term (current) use of anticoagulants: Secondary | ICD-10-CM | POA: Diagnosis not present

## 2014-07-11 LAB — PROTIME-INR
INR: 2.39 — ABNORMAL HIGH (ref 0.00–1.49)
PROTHROMBIN TIME: 26.3 s — AB (ref 11.6–15.2)

## 2014-07-18 ENCOUNTER — Encounter (HOSPITAL_COMMUNITY)
Admission: RE | Admit: 2014-07-18 | Discharge: 2014-07-18 | Disposition: A | Discharge status: Home / Self Care | Payer: Commercial Managed Care - HMO | Source: Skilled Nursing Facility | Attending: Internal Medicine | Admitting: Internal Medicine

## 2014-07-18 DIAGNOSIS — Z7901 Long term (current) use of anticoagulants: Secondary | ICD-10-CM | POA: Diagnosis not present

## 2014-07-18 LAB — PROTIME-INR
INR: 2.7 — ABNORMAL HIGH (ref 0.00–1.49)
PROTHROMBIN TIME: 28.9 s — AB (ref 11.6–15.2)

## 2014-08-01 ENCOUNTER — Encounter (HOSPITAL_COMMUNITY)
Admission: RE | Admit: 2014-08-01 | Discharge: 2014-08-01 | Disposition: A | Discharge status: Home / Self Care | Payer: Commercial Managed Care - HMO | Source: Skilled Nursing Facility | Attending: Internal Medicine | Admitting: Internal Medicine

## 2014-08-01 DIAGNOSIS — Z7901 Long term (current) use of anticoagulants: Secondary | ICD-10-CM | POA: Insufficient documentation

## 2014-08-01 DIAGNOSIS — I482 Chronic atrial fibrillation: Secondary | ICD-10-CM | POA: Insufficient documentation

## 2014-08-01 LAB — PROTIME-INR
INR: 2.26 — ABNORMAL HIGH (ref 0.00–1.49)
PROTHROMBIN TIME: 25.1 s — AB (ref 11.6–15.2)

## 2014-08-12 ENCOUNTER — Encounter (HOSPITAL_COMMUNITY)
Admission: RE | Admit: 2014-08-12 | Discharge: 2014-08-12 | Disposition: A | Discharge status: Home / Self Care | Payer: Commercial Managed Care - HMO | Attending: Internal Medicine | Admitting: Internal Medicine

## 2014-08-12 ENCOUNTER — Non-Acute Institutional Stay (SKILLED_NURSING_FACILITY): Payer: Commercial Managed Care - HMO | Admitting: Internal Medicine

## 2014-08-12 DIAGNOSIS — M159 Polyosteoarthritis, unspecified: Secondary | ICD-10-CM

## 2014-08-12 DIAGNOSIS — R531 Weakness: Secondary | ICD-10-CM | POA: Diagnosis not present

## 2014-08-12 DIAGNOSIS — I10 Essential (primary) hypertension: Secondary | ICD-10-CM

## 2014-08-12 DIAGNOSIS — Z7901 Long term (current) use of anticoagulants: Secondary | ICD-10-CM

## 2014-08-12 DIAGNOSIS — I4891 Unspecified atrial fibrillation: Secondary | ICD-10-CM

## 2014-08-12 DIAGNOSIS — M150 Primary generalized (osteo)arthritis: Secondary | ICD-10-CM | POA: Diagnosis not present

## 2014-08-12 DIAGNOSIS — I482 Chronic atrial fibrillation: Secondary | ICD-10-CM | POA: Diagnosis not present

## 2014-08-12 LAB — URINE MICROSCOPIC-ADD ON

## 2014-08-12 LAB — URINALYSIS, ROUTINE W REFLEX MICROSCOPIC
Glucose, UA: NEGATIVE mg/dL
Ketones, ur: 15 mg/dL — AB
Leukocytes, UA: NEGATIVE
Nitrite: NEGATIVE
Protein, ur: 30 mg/dL — AB
Specific Gravity, Urine: >1.03 — ABNORMAL HIGH (ref 1.005–1.030)
UROBILINOGEN UA: 1 mg/dL (ref 0.0–1.0)
pH: 5.5 (ref 5.0–8.0)

## 2014-08-12 NOTE — Progress Notes (Signed)
Patient ID: [name], male   DOB: 05-31-1915, 81 y.o.   MRN: [id number]      this is a routine visit.  Level of care skilled.  Facility [facility]   Chief complaint-medical management of atrial fibrillation depression hypertension osteoarthritis history of CVA renal insufficiency allergic rhinitis-.  .  History of present illness  .  Patient is a pleasant 81 year old male with the above diagnoses-he appears to be relatively stable.  He recently did lose a son and apparently has been somewhat more subdued since then apparently telling nursing that it should've been him instead.  He is on Lexapro 5 mg a day-. And did receive Ativan short-term status post a son's death  \Nursing also reported today he is complaining of some lower abdominal discomfort possible dysuria he does have a history UTIs and a urine culture is been obtained  It appears his weight has been relatively stable he is on supplements including Magic cup and ensure appears he's lost a couple pounds this month.  Earlier this year he did develop a URI and had renal insufficiency did receive IV fluids and antibiotic and responded nicely to this   .  He also has a history of CVA this has not been an issue during his stay here although he does have general frailty he is on Coumadin with a history of atrial fibrillation--INR is therapeutic update INR has been ordered.--   Regards to his atrial fibrillation this is rate controlled he is not on any rate control agents again he is on Coumadin-we are monitoring his INR and an update is pending.  Patient does have a history of osteoarthritic pain he had been on Celebrex however he developed increased renal insufficiency and this was discontinued  -his pain currently is controlled with Norco.prn As well as Tylenol which is given routine   -  He does have a history of bradycardia this is not new-there is suspicion is this is sick sinus syndrome-he is asymptomatic --pulses  is 60 on exam this evening     /  -.  Family medical social history has been reviewed per history and physical on 06/24/2011  .  Medications have been reviewed per MAR.  Review of systems--limited since patient appears to be a poor historian obtain from patient and nursing  .  In general denies any fever or chills   Skin-  No itching or rashes have been noted  Head ears eyes nose mouth and throat-does have prescription lenses but apparently does not wear these very often--- does not complaining of sore throat today D.  Respiratory-no complaints shortness breath Or cough does have a history of URI quite significant  Cardiac-history of bradycardia and atrial fibrillation but does not complain of chest pain.  GI- Does not complain specifically of abdominal discomfort but apparently has complained of some discomfort to nursing lower abdominal GU area l.  Muscle skeletal-at times will complain of right lower arm pain-apparently the Norco and Tylenol is effective.-Complains of somewhat generalized discomfort tonight apparently has done this before with previous UTIs according to nursing Neurologic-no complaints of dizziness or headache or syncopal-type feelings  .  Physical exam.   He is afebrile pulse of 64 respirations 20 blood pressure taken manually 120/60 weight is 99.4  this appears to be loss of a couple pounds this month and about 5 pounds since beginning of the year .  In general this is a frail elderly male in no distress. resting comfortably in bed  The skin is warm and dry do not note any increased bruising or bleeding.  He does have numerous solar induced changes on his face and ears  L.  Eyes pupils are. reactive to light sclera and conjunctiva are clear- visual acuity appears intact-.  Oropharynx is clear mucous membranes moist--I do not note any exudate or increased erythema.  Chest is clear to auscultation there is no labored  breathing--however respiratory effort is quite poor  Heart is irregular irregular rate rhythm with a rate of 60 -there is no lower extremity edema.  Abdomen soft appears to have possibly some lower abdominal suprapubic tenderness-.  Muscle skeletal-does have general frailty --I did not appreciate any deformities t--there are arthritic changes. and general frailty Neurologic-his speech is somewhat dysarthric but certainly understandable-is able to move all extremities at baseline.--He is not really talking much this evening  Psych-he is cooperative with exam-  .  Labs  08/01/2014.  INR 2.26.  04/27/2014.  Creatinine 1.01 BUN 28 sodium 136 potassium 4.4.  WBC 8.5 hemoglobin 11.1 platelets 379.  Liver function tests within normal limits except albumin of 3.1  12/22/2013.  WBC 6.9 hemoglobin 13.0 which 268.  Sodium 136 potassium 4.5 BUN 24 creatinine 1.08  01/31/2014.  INR 2.43.  12/22/2013.  WBC 6.9 hemoglobin 13.0 platelets 268.  Sodium 136 potassium 4.5 BUN 24 creatinine 1.08.   12/20/2013.  INR-2.01.  10/13/2013  WBC 6.7-hemoglobin 12.2-platelets 251.  Sodium 137-potassium 4.4-CO2 level XXIX-BUN 27-creatinine 1.12.  Liver function tests within normal limits except albumin of 3.2.  Marland Kitchen.   10/04/2013.  INR-2.39.  07/12/2013.  WBC 7.3 hemoglobin 11.6 platelets 265.  Sodium 137 potassium 4.5 BUN 30 creatinine 1.13.  07/07/2013.  INR 1.91.  07/05/2013.  INR 1.98.  06/28/2013.  INR 1.41.  06/04/2013.  WBC 11.2 hemoglobin 11.9 platelets 387.  06/01/2013.  Sodium 140 potassium 4.8 BUN 29 creatinine 0.99.  05/12/2013.  INR 2.47  03/29/2013.  WBC 6.9 hemoglobin 12.0 platelets 274.  Sodium 135 potassium 4.2 BUN 30 creatinine 1.19.  Liver function tests within normal limits except albumin of 3.2  03/17/2013.  INR 2.43.  12/07/2012.  WBC 8.0 hemoglobin 12.1 platelets 274.  Sodium 138 potassium 4.4 BUN 29 creatinine 1.17.   27 2014.  INR-2.59.  09/07/2012.  WBC 6.4 hemoglobin 12.0 platelets 285.  Sodium 138 potassium 4.5 BUN 30 creatinine 1.18.  Liver function tests within normal limits except albumin of 3.2.  .  08/16/2012.  INR-2.25.  2- 24th 2014.  WBC 7.8 hemoglobin 12.5 platelets 241.  Sodium 135 potassium 4.1 BUN 23 creatinine 1.17    Assessment and Plan  Number -1- history of atrial fibrillation-this is rate controlled on no rate control agents-he is on Coumadin- update INR is pending  #2-history CVA-appears to be at baseline he is on anticoagulation as noted above  #3-renal insufficiency- this appears stable but will need updating I have some concern that he is not eating quite as well since the death of his son .  #4-hypertension the is on no meds-Norvasc was discontinued secondary to stable blood pressure-this appears to continue to be stable-     #5-history osteoarthritis-he is on Norco as well as Tylenol this appears to help--pain appears to be under decent control--he has some discomfort tonight but per discussion with nursing suspect this may be either related to UTI or some concern with the recent passing of his son.  #6-bradycardia-this is at baseline he is asymptomatic continue to monitor not a good candidate for aggressive  workup  .  #7-depression-some concern here with recent passing of his son-consider a psychiatric consult .  #8-weight loss-this appears to be somewhat moderate but I suspect he is at high risk for this continuing with his advanced age and numerous comorbidities this will have to be watched     #9 constipation-he is on Colace as well as MiraLax apparently this is stabilized   #10 history of UTIs with some what appears to be possible suprapubic pain per nursing and exam tonight-will check a urine culture       Of note in addition to metabolic panel well update CBC for updated values    [id number]    .

## 2014-08-13 ENCOUNTER — Encounter (HOSPITAL_COMMUNITY)
Admission: RE | Admit: 2014-08-13 | Discharge: 2014-08-13 | Disposition: A | Discharge status: Home / Self Care | Payer: Commercial Managed Care - HMO | Attending: Internal Medicine | Admitting: Internal Medicine

## 2014-08-13 DIAGNOSIS — I482 Chronic atrial fibrillation: Secondary | ICD-10-CM | POA: Diagnosis not present

## 2014-08-13 LAB — CBC
HCT: 38.8 % — ABNORMAL LOW (ref 39.0–52.0)
HEMOGLOBIN: 13.3 g/dL (ref 13.0–17.0)
MCH: 32.8 pg (ref 26.0–34.0)
MCHC: 34.3 g/dL (ref 30.0–36.0)
MCV: 95.8 fL (ref 78.0–100.0)
Platelets: 228 10*3/uL (ref 150–400)
RBC: 4.05 MIL/uL — ABNORMAL LOW (ref 4.22–5.81)
RDW: 13.3 % (ref 11.5–15.5)
WBC: 14.1 10*3/uL — ABNORMAL HIGH (ref 4.0–10.5)

## 2014-08-13 LAB — COMPREHENSIVE METABOLIC PANEL
ALT: 16 U/L — AB (ref 17–63)
AST: 28 U/L (ref 15–41)
Albumin: 3.6 g/dL (ref 3.5–5.0)
Alkaline Phosphatase: 65 U/L (ref 38–126)
Anion gap: 9 (ref 5–15)
BUN: 36 mg/dL — ABNORMAL HIGH (ref 6–20)
CHLORIDE: 98 mmol/L — AB (ref 101–111)
CO2: 26 mmol/L (ref 22–32)
CREATININE: 1.06 mg/dL (ref 0.61–1.24)
Calcium: 8.9 mg/dL (ref 8.9–10.3)
GFR calc non Af Amer: 57 mL/min — ABNORMAL LOW (ref >60)
GLUCOSE: 104 mg/dL — AB (ref 65–99)
POTASSIUM: 4.1 mmol/L (ref 3.5–5.1)
Sodium: 133 mmol/L — ABNORMAL LOW (ref 135–145)
Total Bilirubin: 0.7 mg/dL (ref 0.3–1.2)
Total Protein: 6.5 g/dL (ref 6.5–8.1)

## 2014-08-13 LAB — URINE CULTURE

## 2014-08-26 ENCOUNTER — Non-Acute Institutional Stay: Payer: Commercial Managed Care - HMO | Admitting: Internal Medicine

## 2014-08-26 ENCOUNTER — Encounter: Payer: Self-pay | Admitting: Internal Medicine

## 2014-08-26 ENCOUNTER — Other Ambulatory Visit: Payer: Self-pay | Admitting: *Deleted

## 2014-08-26 DIAGNOSIS — R627 Adult failure to thrive: Secondary | ICD-10-CM | POA: Insufficient documentation

## 2014-08-26 MED ORDER — HYDROCODONE-ACETAMINOPHEN 5-325 MG PO TABS: ORAL_TABLET | ORAL | Status: AC

## 2014-08-26 NOTE — Telephone Encounter (Signed)
[facility] 

## 2014-08-26 NOTE — Progress Notes (Signed)
Patient ID: [name], male   DOB: Jan 09, 1916, 81 y.o.   MRN: [id number]      this is an acute.  Level of care skilled.  Facility [facility]   Chief complaint- Acute visit follow-up failure to thrive.  Marland Kitchen  History of present illness  .  Patient is a pleasant 81 year old male numerous diagnoses including atrial fibrillation on chronic Coumadin-depression-osteoarhtritis--and history CVA.  Patiently recently lost his son-he has gradually been declining not eating and drinking very well-I spoke with his family today who is in the room-negative do not want any aggressive measures-they basically want comfort measures at this point.  Patient has is apparently stated his desires to the family for this.  It appears he's lost about 4 pounds the past month-he is on supplements although I don't think he really takes these all that well.  Recent vital signs appear to be stable-  He is on chronic Coumadin for history of atrial fibrillation-  Today he just says he somewhat uncomfortable although does not specifically complain of pain-he is on Tylenol routinely for osteoarthritis as well as Norco when necessary.  He appears to lost some weight tear appears even more frail than I have seen him recently-he does not appear to be in any distress     -.  Family medical social history has been reviewed per history and physical on 06/24/2011  .  Medications have been reviewed per MAR.  Review of systems--limited since patient is a poor historian-again he just says he doesn't feel all that comfortable although does not really specifically complain of acute pain- Nursing staff and family have not noted any complaints of chest pain or shortness of breath or increased cough he does have some history of respiratory issues at times.  Recent urine culture was unremarkable     .  Physical exam.    recent vital signs temperature 98.4 pulse 60 respirations 18 blood pressure 128/79 weight is  96 on about 4 pounds the past month .  In general this is a frail elderly male in no distress. But extremely frail sitting in his chair he does not appear to be uncomfortable but does appear to be somewhat agitated which leads  To  complaints of being uncomfortable  The skin is warm and dry do not note any increased bruising or bleeding.  He does have numerous solar induced changes on his face and ears  L.  Eyes pupils are. reactive to light sclera and conjunctiva are clear- visual acuity appears intact-.  Oropharynx is clear mucous membranes are somewhat dry.  Chest is clear to auscultation there is no labored breathing--however respiratory effort is quite poor  Heart is irregular irregular rate rhythm with a rate of 60 -there is no lower extremity edema.  Abdomen soft appear to be tender there are positive bowel sounds-.  Muscle skeletal-does have general frailty --I did not appreciate any deformities t--there are arthritic changes. and general frailty Neurologic-his speech is somewhat dysarthric but certainly understandable- He has never been real talkative but is not talking much this evening however he is alert and does respond when asked questions  Psych-he is cooperative with exam-  .  Labs  08/13/2014.  Sodium 133 potassium 4.1 BUN 36 creatinine 1.06.  Albumen 3.6-otherwise liver function tests essentially within normal limits.  Hemoglobin was 13.3 platelets 228 white count 14.1  08/01/2014.  INR 2.26.  04/27/2014.  Creatinine 1.01 BUN 28 sodium 136 potassium 4.4.  WBC 8.5 hemoglobin 11.1 platelets 379.  Liver function tests within normal limits except albumin of 3.1  12/22/2013.  WBC 6.9 hemoglobin 13.0 which 268.  Sodium 136 potassium 4.5 BUN 24 creatinine 1.08  01/31/2014.  INR 2.43.  12/22/2013.  WBC 6.9 hemoglobin 13.0 platelets 268.  Sodium 136 potassium 4.5 BUN 24 creatinine 1.08.   12/20/2013.  INR-2.01.  10/13/2013  WBC  6.7-hemoglobin 12.2-platelets 251.  Sodium 137-potassium 4.4-CO2 level XXIX-BUN 27-creatinine 1.12.  Liver function tests within normal limits except albumin of 3.2.  Marland Kitchen.   10/04/2013.  INR-2.39.  07/12/2013.  WBC 7.3 hemoglobin 11.6 platelets 265.  Sodium 137 potassium 4.5 BUN 30 creatinine 1.13.  07/07/2013.  INR 1.91.  07/05/2013.  INR 1.98.  06/28/2013.  INR 1.41.  06/04/2013.  WBC 11.2 hemoglobin 11.9 platelets 387.  06/01/2013.  Sodium 140 potassium 4.8 BUN 29 creatinine 0.99.  05/12/2013.  INR 2.47  03/29/2013.  WBC 6.9 hemoglobin 12.0 platelets 274.  Sodium 135 potassium 4.2 BUN 30 creatinine 1.19.  Liver function tests within normal limits except albumin of 3.2  03/17/2013.  INR 2.43.  12/07/2012.  WBC 8.0 hemoglobin 12.1 platelets 274.  Sodium 138 potassium 4.4 BUN 29 creatinine 1.17.  27 2014.  INR-2.59.  09/07/2012.  WBC 6.4 hemoglobin 12.0 platelets 285.  Sodium 138 potassium 4.5 BUN 30 creatinine 1.18.  Liver function tests within normal limits except albumin of 3.2.  .  08/16/2012.  INR-2.25.  2- 24th 2014.  WBC 7.8 hemoglobin 12.5 platelets 241.  Sodium 135 potassium 4.1 BUN 23 creatinine 1.17    Assessment and Plan  Failure to thrive-I did have a discussion with his daughter his responsible  party at bedside-family has been very clear that they do not want any aggressive measures they want essentially comfort care-no hospitalization no further labs and no IVs no feeding tube.  At some point I suspect we will also try to minimize his medications-apparently he is still taking them fairly well-although I would wonder about the utility of doing this long-term and the pill-burden-we will discuss this with Dr. Leanord Hawkingobson.  Clinically he does not appear to be unstable but very frail-we will also discontinue monitoring the weights-again the emphasis is on comfort.  He does have Tylenol routinely for pain as well as  Norco as needed-apparently this is effective at this point but will have to be monitored-at some point we may have to bring in morphine but at this point does not appear to be the case.  [id number]     .

## 2014-09-30 DEATH — deceased

## 2015-11-13 IMAGING — CR DG CHEST 1V
1 series · 1 of 1 positions shown · non-contrast
Comparison: Chest x-ray of 06/14/2011

CLINICAL DATA: Cough, congestion, low-grade fever

EXAM:
CHEST - 1 VIEW

[view not recorded]
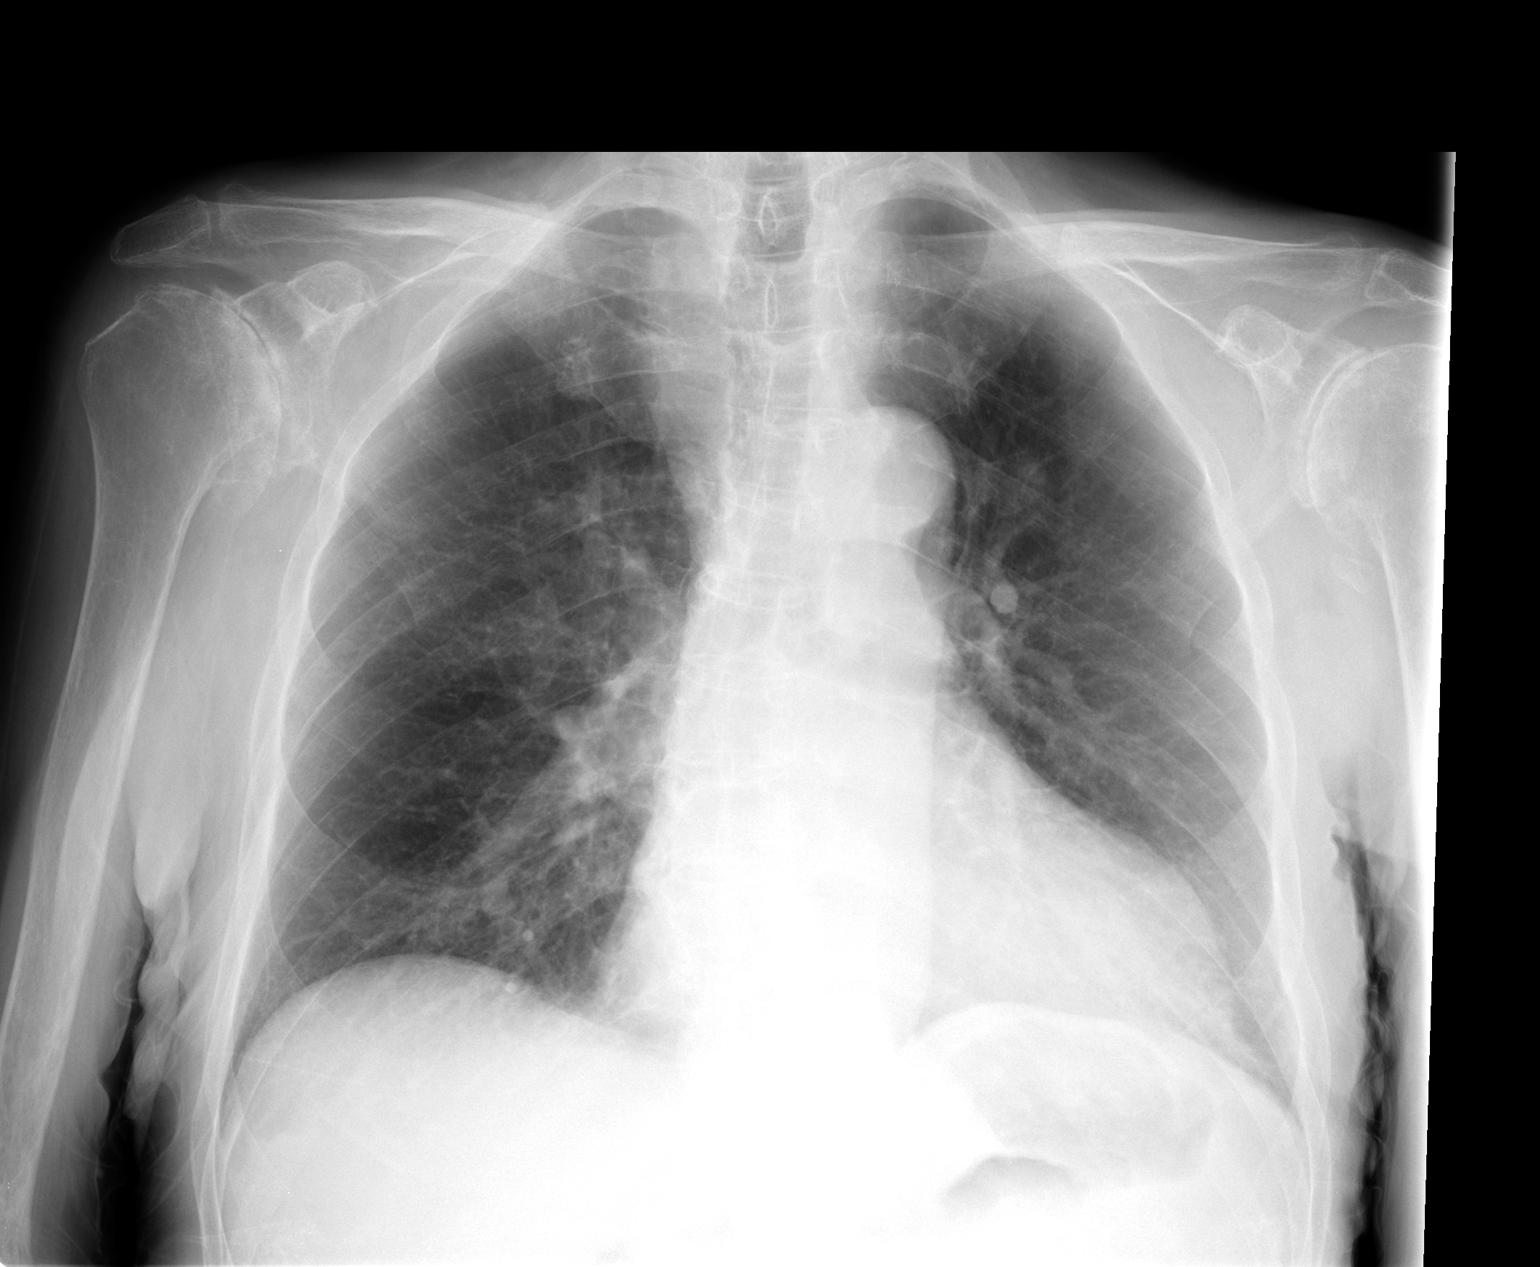

[1 of 1 positions shown; findings below may reference images not displayed]

FINDINGS: No active infiltrate or effusion is seen. Cardiomegaly is stable.
Mediastinal contours appear stable. Degenerative joint disease is
noted involving the shoulders right much greater than left.
IMPRESSION: No active lung disease.  Stable cardiomegaly.

## 2015-11-18 IMAGING — CR DG CHEST 1V
1 series · 1 of 1 positions shown · non-contrast
Comparison: DG CHEST 1 VIEW dated 05/21/2013

CLINICAL DATA: CHEST CONGESTION, COUGH

EXAM:
CHEST - 1 VIEW

[view not recorded]
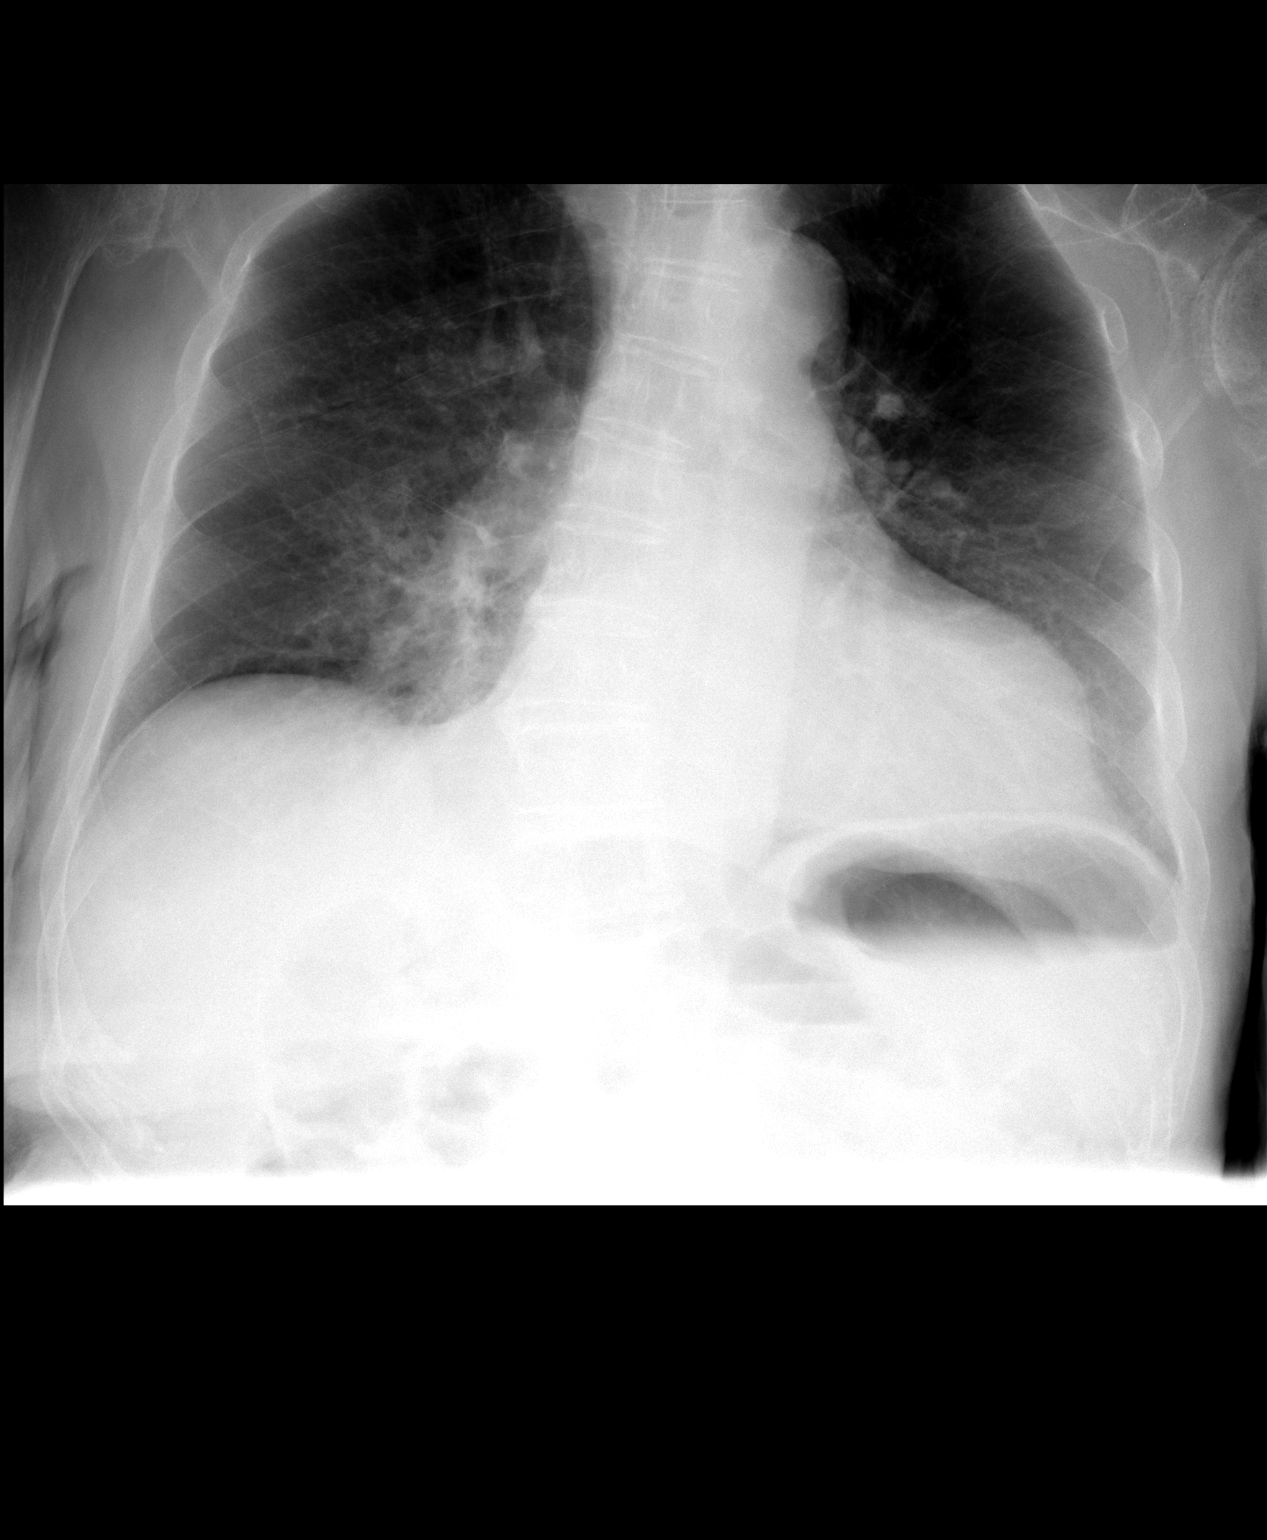

[1 of 1 positions shown; findings below may reference images not displayed]

FINDINGS: Low lung volumes. Cardiac silhouette is enlarged. Increased density
projects within the medial aspect of the right lung base. The
osseous structures demonstrate degenerative changes within the
shoulders.
IMPRESSION: Infiltrate versus atelectasis right lower lobe. Stable cardiomegaly.

## 2016-07-28 IMAGING — CR DG CHEST 1V
1 series · 1 of 1 positions shown · non-contrast
Comparison: 12/22/2013

CLINICAL DATA: Cough, congestion

EXAM:
CHEST - 1 VIEW

[view not recorded]
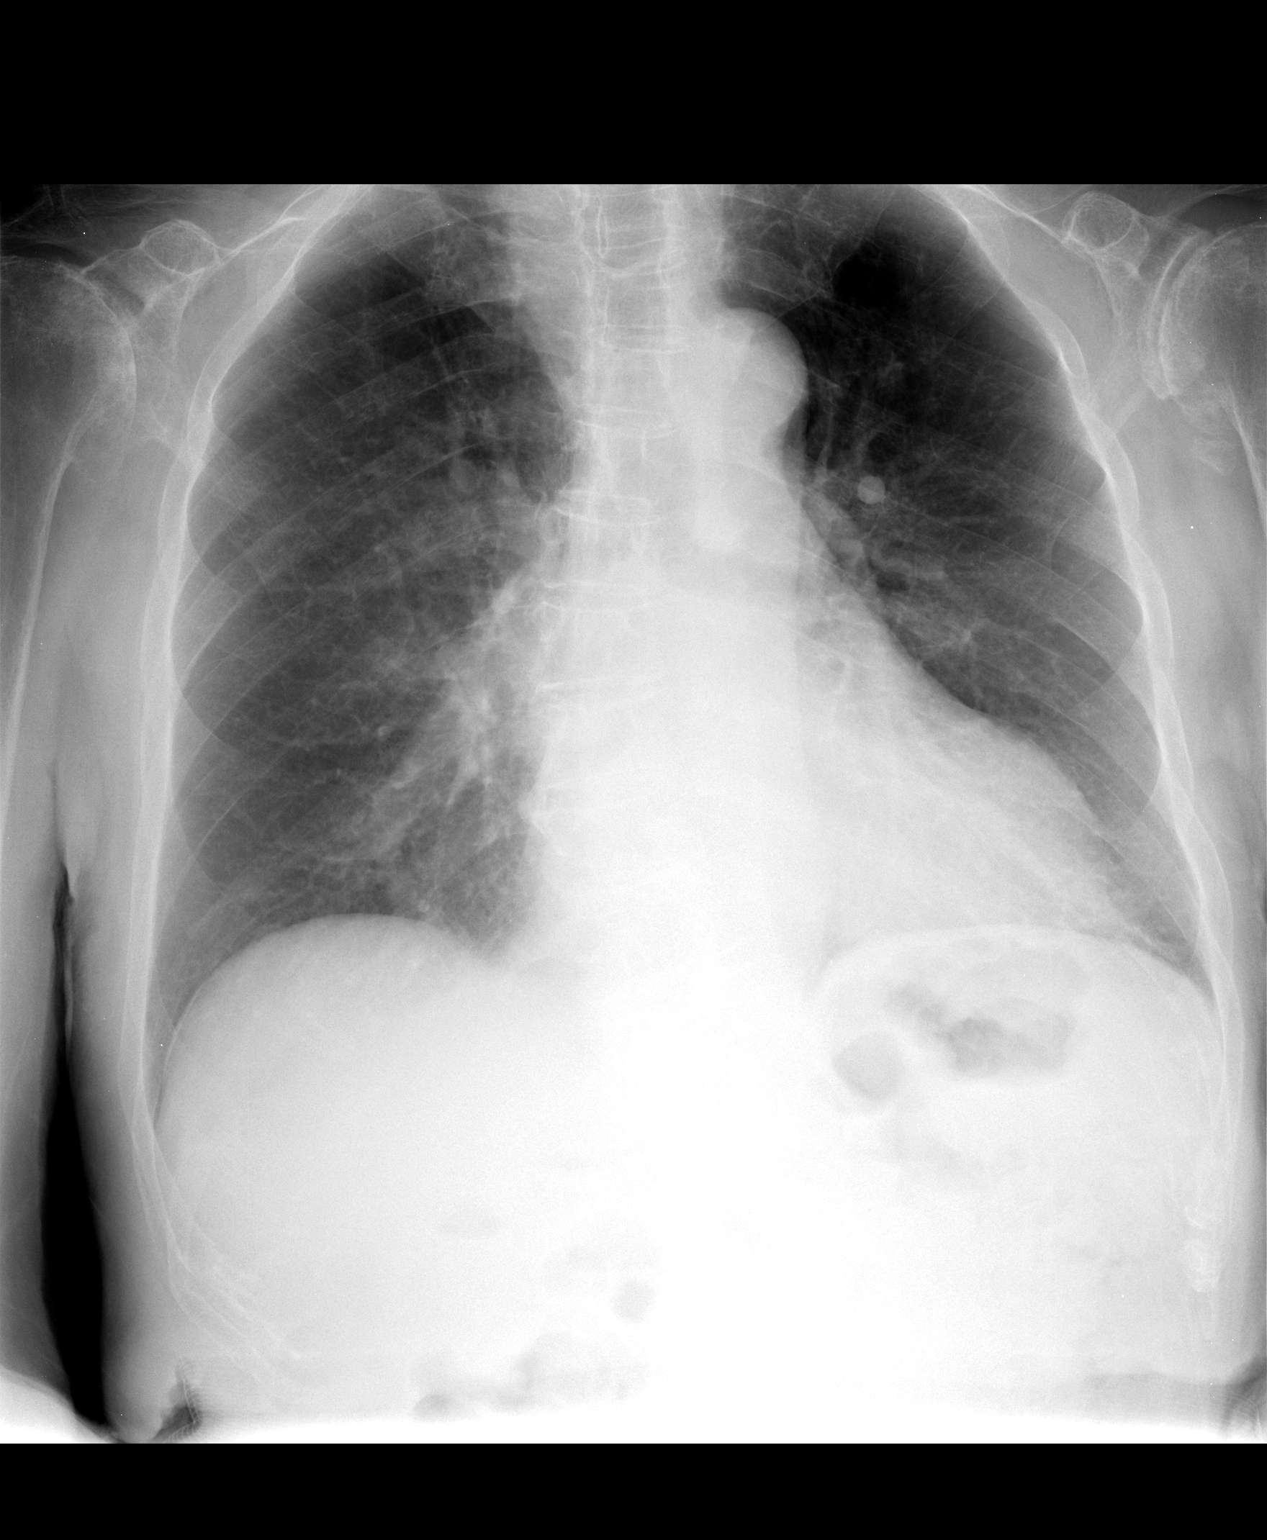

[1 of 1 positions shown; findings below may reference images not displayed]

FINDINGS: Chronic interstitial markings. No focal consolidation. Mild left
basilar opacity, likely atelectasis/scarring. No pleural effusion or
pneumothorax.

Heart is top-normal in size.
IMPRESSION: No evidence of acute cardiopulmonary disease.
